# Patient Record
Sex: Male | Born: 1975 | Race: Black or African American | Hispanic: No | Marital: Single | Smoking: Former smoker
Health system: Southern US, Community
[De-identification: ages and names within clinical notes are randomized; demographics above are authoritative.]

## PROBLEM LIST (undated history)

## (undated) DIAGNOSIS — Z21 Asymptomatic human immunodeficiency virus [HIV] infection status: Secondary | ICD-10-CM

## (undated) DIAGNOSIS — J45909 Unspecified asthma, uncomplicated: Secondary | ICD-10-CM

## (undated) DIAGNOSIS — J40 Bronchitis, not specified as acute or chronic: Secondary | ICD-10-CM

## (undated) DIAGNOSIS — I10 Essential (primary) hypertension: Secondary | ICD-10-CM

## (undated) HISTORY — PX: WISDOM TOOTH EXTRACTION: SHX21

## (undated) HISTORY — PX: TONSILLECTOMY: SUR1361

## (undated) HISTORY — PX: HERNIA REPAIR: SHX51

---

## 2018-01-01 ENCOUNTER — Other Ambulatory Visit: Payer: Self-pay

## 2018-01-01 ENCOUNTER — Emergency Department
Admission: EM | Admit: 2018-01-01 | Discharge: 2018-01-01 | Disposition: A | Discharge status: Home / Self Care | Payer: Self-pay | Attending: Emergency Medicine | Admitting: Emergency Medicine

## 2018-01-01 ENCOUNTER — Encounter: Payer: Self-pay | Admitting: Emergency Medicine

## 2018-01-01 ENCOUNTER — Emergency Department: Payer: Self-pay

## 2018-01-01 DIAGNOSIS — I10 Essential (primary) hypertension: Secondary | ICD-10-CM | POA: Insufficient documentation

## 2018-01-01 DIAGNOSIS — R05 Cough: Secondary | ICD-10-CM | POA: Insufficient documentation

## 2018-01-01 DIAGNOSIS — R0981 Nasal congestion: Secondary | ICD-10-CM | POA: Insufficient documentation

## 2018-01-01 DIAGNOSIS — J40 Bronchitis, not specified as acute or chronic: Secondary | ICD-10-CM | POA: Insufficient documentation

## 2018-01-01 DIAGNOSIS — B20 Human immunodeficiency virus [HIV] disease: Secondary | ICD-10-CM | POA: Insufficient documentation

## 2018-01-01 DIAGNOSIS — F17210 Nicotine dependence, cigarettes, uncomplicated: Secondary | ICD-10-CM | POA: Insufficient documentation

## 2018-01-01 HISTORY — DX: Essential (primary) hypertension: I10

## 2018-01-01 HISTORY — DX: Asymptomatic human immunodeficiency virus (hiv) infection status: Z21

## 2018-01-01 HISTORY — DX: Bronchitis, not specified as acute or chronic: J40

## 2018-01-01 HISTORY — DX: Unspecified asthma, uncomplicated: J45.909

## 2018-01-01 LAB — CBC WITH DIFFERENTIAL/PLATELET
Abs Immature Granulocytes: 0.05 10*3/uL (ref 0.00–0.07)
BASOS ABS: 0 10*3/uL (ref 0.0–0.1)
Basophils Relative: 1 %
Eosinophils Absolute: 0.1 10*3/uL (ref 0.0–0.5)
Eosinophils Relative: 3 %
HCT: 41.2 % (ref 39.0–52.0)
Hemoglobin: 13.5 g/dL (ref 13.0–17.0)
Immature Granulocytes: 1 %
Lymphocytes Relative: 47 %
Lymphs Abs: 2 10*3/uL (ref 0.7–4.0)
MCH: 29.2 pg (ref 26.0–34.0)
MCHC: 32.8 g/dL (ref 30.0–36.0)
MCV: 89.2 fL (ref 80.0–100.0)
Monocytes Absolute: 0.5 10*3/uL (ref 0.1–1.0)
Monocytes Relative: 12 %
NEUTROS PCT: 36 %
NRBC: 0 % (ref 0.0–0.2)
Neutro Abs: 1.6 10*3/uL — ABNORMAL LOW (ref 1.7–7.7)
Platelets: 237 10*3/uL (ref 150–400)
RBC: 4.62 MIL/uL (ref 4.22–5.81)
RDW: 14.1 % (ref 11.5–15.5)
WBC: 4.3 10*3/uL (ref 4.0–10.5)

## 2018-01-01 LAB — COMPREHENSIVE METABOLIC PANEL
ALBUMIN: 3.8 g/dL (ref 3.5–5.0)
ALT: 29 U/L (ref 0–44)
AST: 43 U/L — ABNORMAL HIGH (ref 15–41)
Alkaline Phosphatase: 57 U/L (ref 38–126)
Anion gap: 8 (ref 5–15)
BUN: 14 mg/dL (ref 6–20)
CO2: 25 mmol/L (ref 22–32)
Calcium: 9.2 mg/dL (ref 8.9–10.3)
Chloride: 106 mmol/L (ref 98–111)
Creatinine, Ser: 1.1 mg/dL (ref 0.61–1.24)
GFR calc Af Amer: >60 mL/min (ref >60)
GFR calc non Af Amer: >60 mL/min (ref >60)
Glucose, Bld: 131 mg/dL — ABNORMAL HIGH (ref 70–99)
Potassium: 3.6 mmol/L (ref 3.5–5.1)
Sodium: 139 mmol/L (ref 135–145)
Total Bilirubin: 0.4 mg/dL (ref 0.3–1.2)
Total Protein: 8 g/dL (ref 6.5–8.1)

## 2018-01-01 LAB — TROPONIN I: Troponin I: <0.03 ng/mL (ref <0.03)

## 2018-01-01 MED ORDER — IPRATROPIUM-ALBUTEROL 0.5-2.5 (3) MG/3ML IN SOLN
3.0000 mL | Freq: Once | RESPIRATORY_TRACT | Status: AC
  Administered 2018-01-01: 3 mL via RESPIRATORY_TRACT
  Filled 2018-01-01: qty 3

## 2018-01-01 MED ORDER — ALBUTEROL SULFATE HFA 108 (90 BASE) MCG/ACT IN AERS: 2.0000 | INHALATION_SPRAY | Freq: Four times a day (QID) | RESPIRATORY_TRACT | 0 refills | Status: DC | PRN

## 2018-01-01 NOTE — Discharge Instructions (Addendum)
Please seek medical attention for any high fevers, chest pain, shortness of breath, change in behavior, persistent vomiting, bloody stool or any other new or concerning symptoms.  

## 2018-01-01 NOTE — ED Triage Notes (Signed)
Pt reports ongoing cough and congestion since Wednesday

## 2018-01-01 NOTE — ED Notes (Addendum)
Awaiting ct xray results

## 2018-01-01 NOTE — ED Triage Notes (Signed)
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  Pt reports taking mucinex and albuterol prior to coming to ED today

## 2018-01-01 NOTE — ED Provider Notes (Signed)
[facility] Emergency Department Provider Note   ____________________________________________   I have reviewed the triage vital signs and the nursing notes.   HISTORY  Chief Complaint Chest Pain; Nasal Congestion; and Cough   History limited by: Not Limited   HPI Dillon Hood is a 42 y.o. male who presents to the emergency department today because of concerns for cough, congestion and some chest tightness.  Patient states that he has a history of bronchitis.  He states that he has run out of his albuterol inhaler.  He states for the past few days he has noticed some increased difficulty with breathing, cough.  He denies any chest pain but states it has felt tight.  Patient denies any fevers.   Per medical record review patient has a history of asthma, bronchitis Past Medical History:  Diagnosis Date  . Asthma   . Bronchitis   . HIV positive (HCC)   . Hypertension     There are no active problems to display for this patient.   Past Surgical History:  Procedure Laterality Date  . HERNIA REPAIR     umbilical  . TONSILLECTOMY    . WISDOM TOOTH EXTRACTION      Prior to Admission medications   Not on File    Allergies Macadamia nut oil; Shellfish allergy; and Iodinated diagnostic agents  History reviewed. No pertinent family history.  Social History Social History   Tobacco Use  . Smoking status: Current Some Day Smoker    Packs/day: 0.25    Years: 25.00    Pack years: 6.25    Types: Cigarettes  . Smokeless tobacco: Never Used  Substance Use Topics  . Alcohol use: Not Currently  . Drug use: Not Currently    Types: Cocaine, Marijuana, Methamphetamines    Review of Systems Constitutional: No fever/chills Eyes: No visual changes. ENT: No sore throat. Cardiovascular: Positive for chest tightness Respiratory: Denies shortness of breath. Gastrointestinal: No abdominal pain.  No nausea, no vomiting.  No diarrhea.   Genitourinary:  Negative for dysuria. Musculoskeletal: Negative for back pain. Skin: Negative for rash. Neurological: Negative for headaches, focal weakness or numbness.  ____________________________________________   PHYSICAL EXAM:  VITAL SIGNS: ED Triage Vitals  Enc Vitals Group     BP 01/01/18 1947 (!) 144/80     Pulse Rate 01/01/18 1947 81     Resp 01/01/18 1947 18     Temp 01/01/18 1947 98.5 F (36.9 C)     Temp Source 01/01/18 1947 Oral     SpO2 01/01/18 1947 97 %     Weight 01/01/18 1947 280 lb (127 kg)     Height 01/01/18 1947 5\' 10"  (1.778 m)     Head Circumference --      Peak Flow --      Pain Score 01/01/18 2007 7     Pain Loc --      Pain Edu? --      Excl. in GC? --      Constitutional: Alert and oriented.  Eyes: Conjunctivae are normal.  ENT      Head: Normocephalic and atraumatic.      Nose: No congestion/rhinnorhea.      Mouth/Throat: Mucous membranes are moist.      Neck: No stridor. Hematological/Lymphatic/Immunilogical: No cervical lymphadenopathy. Cardiovascular: Normal rate, regular rhythm.  No murmurs, rubs, or gallops.  Respiratory: Normal respiratory effort. Diffuse expiratory wheezing.  Gastrointestinal: Soft and non tender. No rebound. No guarding.  Genitourinary: Deferred Musculoskeletal: Normal range  of motion in all extremities. No lower extremity edema. Neurologic:  Normal speech and language. No gross focal neurologic deficits are appreciated.  Skin:  Skin is warm, dry and intact. No rash noted. Psychiatric: Mood and affect are normal. Speech and behavior are normal. Patient exhibits appropriate insight and judgment.  ____________________________________________    LABS (pertinent positives/negatives)  CMP wnl except glu 131, ast 43 Trop <0.03 CBC wbc 4.3, hgb 13.5, plt 237  ____________________________________________   EKG  I, Phineas Semen, attending physician, personally viewed and interpreted this EKG  EKG Time: 1954 Rate:  73 Rhythm: normal sinus rhythm Axis: left axis deviation Intervals: qtc 412 QRS: LAFB ST changes: no st elevation Impression: abnormal ekg  ____________________________________________    RADIOLOGY  CXR No acute disease  ____________________________________________   PROCEDURES  Procedures  ____________________________________________   INITIAL IMPRESSION / ASSESSMENT AND PLAN / ED COURSE  Pertinent labs & imaging results that were available during my care of the patient were reviewed by me and considered in my medical decision making (see chart for details).   Patient presented to the emergency department today because of concerns for possible bronchitis.  On exam patient did have diffuse expiratory wheezing.  Chest x-ray without pneumonia or pneumothorax.  Patient was given DuoNeb treatment and on repeat auscultation wheezing had improved.  Patient said he felt better.  Will plan to give patient prescription of albuterol inhaler.   ____________________________________________   FINAL CLINICAL IMPRESSION(S) / ED DIAGNOSES  Final diagnoses:  Bronchitis     Note: This dictation was prepared with Dragon dictation. Any transcriptional errors that result from this process are unintentional     Phineas Semen, MD 01/01/18 2359

## 2018-01-02 ENCOUNTER — Encounter: Payer: Self-pay | Admitting: Emergency Medicine

## 2018-01-02 ENCOUNTER — Emergency Department
Admission: EM | Admit: 2018-01-02 | Discharge: 2018-01-02 | Disposition: A | Discharge status: Home / Self Care | Payer: Self-pay | Attending: Emergency Medicine | Admitting: Emergency Medicine

## 2018-01-02 DIAGNOSIS — J209 Acute bronchitis, unspecified: Secondary | ICD-10-CM | POA: Insufficient documentation

## 2018-01-02 DIAGNOSIS — I10 Essential (primary) hypertension: Secondary | ICD-10-CM | POA: Insufficient documentation

## 2018-01-02 DIAGNOSIS — B20 Human immunodeficiency virus [HIV] disease: Secondary | ICD-10-CM | POA: Insufficient documentation

## 2018-01-02 DIAGNOSIS — F17210 Nicotine dependence, cigarettes, uncomplicated: Secondary | ICD-10-CM | POA: Insufficient documentation

## 2018-01-02 DIAGNOSIS — R0981 Nasal congestion: Secondary | ICD-10-CM | POA: Insufficient documentation

## 2018-01-02 MED ORDER — PREDNISONE 20 MG PO TABS
40.0000 mg | ORAL_TABLET | ORAL | Status: AC
  Administered 2018-01-02: 40 mg via ORAL
  Filled 2018-01-02: qty 2

## 2018-01-02 MED ORDER — ALBUTEROL SULFATE (2.5 MG/3ML) 0.083% IN NEBU
INHALATION_SOLUTION | RESPIRATORY_TRACT | Status: AC
  Administered 2018-01-02: 5 mg via RESPIRATORY_TRACT
  Filled 2018-01-02: qty 6

## 2018-01-02 MED ORDER — AZITHROMYCIN 250 MG PO TABS: ORAL_TABLET | ORAL | 0 refills | Status: DC

## 2018-01-02 MED ORDER — PREDNISONE 20 MG PO TABS: 40.0000 mg | ORAL_TABLET | Freq: Every day | ORAL | 0 refills | Status: DC

## 2018-01-02 MED ORDER — ALBUTEROL SULFATE HFA 108 (90 BASE) MCG/ACT IN AERS: 2.0000 | INHALATION_SPRAY | RESPIRATORY_TRACT | 0 refills | Status: AC | PRN

## 2018-01-02 MED ORDER — ALBUTEROL SULFATE (2.5 MG/3ML) 0.083% IN NEBU
5.0000 mg | INHALATION_SOLUTION | Freq: Once | RESPIRATORY_TRACT | Status: AC
  Administered 2018-01-02: 5 mg via RESPIRATORY_TRACT

## 2018-01-02 MED ORDER — AZITHROMYCIN 500 MG PO TABS
500.0000 mg | ORAL_TABLET | Freq: Once | ORAL | Status: AC
  Administered 2018-01-02: 500 mg via ORAL
  Filled 2018-01-02: qty 1

## 2018-01-02 NOTE — ED Provider Notes (Signed)
[facility] Emergency Department Provider Note  ____________________________________________  Time seen: Approximately 10:27 AM  I have reviewed the triage vital signs and the nursing notes.   HISTORY  Chief Complaint Cough and Nasal Congestion    HPI Dillon Hood is a 42 y.o. male with a history of asthma, hypertension, HIV on antiretrovirals with good CD4 and viral load counts 1 month ago per the patient's report who complains of nonproductive cough wheezing shortness of breath and chest tightness for the past 2 to 3 days.  He came to the ED yesterday, felt better after receiving bronchodilators and was discharged home.  He unfortunately was unable to fill the albuterol prescription due to cost.  Last night his symptoms felt worse again and his primary care doctor is unable to see him until Thursday 2 days from now, so he returns to the ED today.   Symptoms are constant waxing and waning without aggravating or alleviating factors.  No history of MI or blood clots.  Denies any severe pleuritic pain or exertional symptoms.  No fevers chills sweats or weight loss.     Past Medical History:  Diagnosis Date  . Asthma   . Bronchitis   . HIV positive (HCC)   . Hypertension      There are no active problems to display for this patient.    Past Surgical History:  Procedure Laterality Date  . HERNIA REPAIR     umbilical  . TONSILLECTOMY    . WISDOM TOOTH EXTRACTION       Prior to Admission medications   Medication Sig Start Date End Date Taking? Authorizing Provider  albuterol (PROVENTIL HFA) 108 (90 Base) MCG/ACT inhaler Inhale 2 puffs into the lungs every 4 (four) hours as needed for wheezing or shortness of breath. 01/02/18   Sharman CheekStafford, Maalle Starrett, MD  albuterol (PROVENTIL HFA;VENTOLIN HFA) 108 (90 Base) MCG/ACT inhaler Inhale 2 puffs into the lungs every 6 (six) hours as needed for wheezing or shortness of breath. 01/01/18   Phineas SemenGoodman, Graydon, MD   azithromycin (ZITHROMAX Z-PAK) 250 MG tablet Take 2 tablets (500 mg) on  Day 1,  followed by 1 tablet (250 mg) once daily on Days 2 through 5. 01/02/18   Sharman CheekStafford, Jamal Haskin, MD  predniSONE (DELTASONE) 20 MG tablet Take 2 tablets (40 mg total) by mouth daily. 01/02/18   Sharman CheekStafford, Vontae Court, MD     Allergies Macadamia nut oil; Shellfish allergy; and Iodinated diagnostic agents   No family history on file.  Social History Social History   Tobacco Use  . Smoking status: Current Some Day Smoker    Packs/day: 0.25    Years: 25.00    Pack years: 6.25    Types: Cigarettes  . Smokeless tobacco: Never Used  Substance Use Topics  . Alcohol use: Not Currently  . Drug use: Not Currently    Types: Cocaine, Marijuana, Methamphetamines    Review of Systems  Constitutional:   No fever or chills.  ENT:   No sore throat.  Positive rhinorrhea. Cardiovascular:   Positive mild chest tightness, without syncope. Respiratory:   Positive shortness of breath and nonproductive cough. Gastrointestinal:   Negative for abdominal pain, vomiting and diarrhea.  Musculoskeletal:   Negative for focal pain or swelling All other systems reviewed and are negative except as documented above in ROS and HPI.  ____________________________________________   PHYSICAL EXAM:  VITAL SIGNS: ED Triage Vitals  Enc Vitals Group     BP 01/02/18 0956 126/63  Pulse Rate 01/02/18 0956 67     Resp 01/02/18 0956 20     Temp 01/02/18 0956 98.2 F (36.8 C)     Temp Source 01/02/18 0956 Oral     SpO2 01/02/18 0956 97 %     Weight 01/02/18 0955 280 lb (127 kg)     Height 01/02/18 0955 5\' 10"  (1.778 m)     Head Circumference --      Peak Flow --      Pain Score 01/02/18 1000 0     Pain Loc --      Pain Edu? --      Excl. in GC? --     Vital signs reviewed, nursing assessments reviewed.   Constitutional:   Alert and oriented. Non-toxic appearance. Eyes:   Conjunctivae are normal. EOMI. PERRL. ENT      Head:    Normocephalic and atraumatic.  There is a 3 cm lipoma on the left parietal scalp.  Patient reports this is previously been evaluated by doctors and deemed benign      Nose:   No congestion/rhinnorhea.       Mouth/Throat:   MMM, no pharyngeal erythema. No peritonsillar mass.       Neck:   No meningismus. Full ROM. Hematological/Lymphatic/Immunilogical:   No cervical lymphadenopathy. Cardiovascular:   RRR. Symmetric bilateral radial and DP pulses.  No murmurs. Cap refill less than 2 seconds. Respiratory:   Normal respiratory effort without tachypnea/retractions.  Good air entry in all lung fields.  FEV1 maneuver provokes wheezing and prolongation of expiratory phase. Gastrointestinal:   Soft and nontender. Non distended. There is no CVA tenderness.  No rebound, rigidity, or guarding.  Musculoskeletal:   Normal range of motion in all extremities. No joint effusions.  No lower extremity tenderness.  No edema. Neurologic:   Normal speech and language.  Motor grossly intact. No acute focal neurologic deficits are appreciated.  Skin:    Skin is warm, dry and intact. No rash noted.  No petechiae, purpura, or bullae.  ____________________________________________    LABS (pertinent positives/negatives) (all labs ordered are listed, but only abnormal results are displayed) Labs Reviewed - No data to display ____________________________________________   EKG    ____________________________________________    RADIOLOGY  Dg Chest 2 View  Result Date: 01/01/2018 CLINICAL DATA:  Chest congestion and tightness as well as low back pain. EXAM: CHEST - 2 VIEW COMPARISON:  None. FINDINGS: The heart size and mediastinal contours are within normal limits. Both lungs are clear. The visualized skeletal structures are unremarkable. IMPRESSION: No active cardiopulmonary disease. Electronically Signed   By: Elberta Fortis M.D.   On: 01/01/2018 20:38     ____________________________________________   PROCEDURES Procedures  ____________________________________________    CLINICAL IMPRESSION / ASSESSMENT AND PLAN / ED COURSE  Pertinent labs & imaging results that were available during my care of the patient were reviewed by me and considered in my medical decision making (see chart for details).    Patient presents with wheezing nonproductive cough and nasal congestion consistent with URI and acute bronchitis.  He was evaluated here yesterday and had an EKG and chest x-ray yesterday that were unremarkable.  No evolving symptoms.  Vital signs are normal.  Most likely viral in nature, but with his underlying HIV despite well controlled, I will start him on azithromycin as well in addition to providing him a short prednisone prescription with his recurrent lung disease.  He has been given information for the medication management clinic,  and I was also provide a pharmacy discount card to help find solutions to his financial constraints.  Considering the patient's symptoms, medical history, and physical examination today, I have low suspicion for ACS, PE, TAD, pneumothorax, carditis, mediastinitis, pneumonia, CHF, or sepsis.  Suitable for continued outpatient follow-up.      ____________________________________________   FINAL CLINICAL IMPRESSION(S) / ED DIAGNOSES    Final diagnoses:  Acute bronchitis, unspecified organism     ED Discharge Orders         Ordered    azithromycin (ZITHROMAX Z-PAK) 250 MG tablet     01/02/18 1027    predniSONE (DELTASONE) 20 MG tablet  Daily     01/02/18 1027    albuterol (PROVENTIL HFA) 108 (90 Base) MCG/ACT inhaler  Every 4 hours PRN     01/02/18 1027          Portions of this note were generated with dragon dictation software. Dictation errors may occur despite best attempts at proofreading.    Sharman Cheek, MD 01/02/18 1031

## 2018-01-02 NOTE — ED Triage Notes (Signed)
Pt reports was seen here last night and given a breathing treatment and a prescription for an inhaler but he is not able to get it filled at this time and feels like he needs another treatment and maybe a steroid.

## 2020-01-15 ENCOUNTER — Emergency Department (HOSPITAL_COMMUNITY): Payer: Self-pay

## 2020-01-15 ENCOUNTER — Emergency Department (HOSPITAL_COMMUNITY)
Admission: EM | Admit: 2020-01-15 | Discharge: 2020-01-15 | Disposition: A | Discharge status: Home / Self Care | Payer: Self-pay | Attending: Emergency Medicine | Admitting: Emergency Medicine

## 2020-01-15 ENCOUNTER — Encounter (HOSPITAL_COMMUNITY): Payer: Self-pay

## 2020-01-15 DIAGNOSIS — R569 Unspecified convulsions: Secondary | ICD-10-CM | POA: Insufficient documentation

## 2020-01-15 DIAGNOSIS — K2970 Gastritis, unspecified, without bleeding: Secondary | ICD-10-CM | POA: Insufficient documentation

## 2020-01-15 DIAGNOSIS — Z21 Asymptomatic human immunodeficiency virus [HIV] infection status: Secondary | ICD-10-CM | POA: Insufficient documentation

## 2020-01-15 DIAGNOSIS — Z79899 Other long term (current) drug therapy: Secondary | ICD-10-CM | POA: Insufficient documentation

## 2020-01-15 DIAGNOSIS — I10 Essential (primary) hypertension: Secondary | ICD-10-CM | POA: Insufficient documentation

## 2020-01-15 DIAGNOSIS — F17210 Nicotine dependence, cigarettes, uncomplicated: Secondary | ICD-10-CM | POA: Insufficient documentation

## 2020-01-15 DIAGNOSIS — J45909 Unspecified asthma, uncomplicated: Secondary | ICD-10-CM | POA: Insufficient documentation

## 2020-01-15 LAB — BASIC METABOLIC PANEL
Anion gap: 8 (ref 5–15)
BUN: 10 mg/dL (ref 6–20)
CO2: 26 mmol/L (ref 22–32)
Calcium: 8.9 mg/dL (ref 8.9–10.3)
Chloride: 104 mmol/L (ref 98–111)
Creatinine, Ser: 0.99 mg/dL (ref 0.61–1.24)
GFR, Estimated: >60 mL/min (ref >60)
Glucose, Bld: 110 mg/dL — ABNORMAL HIGH (ref 70–99)
Potassium: 3.7 mmol/L (ref 3.5–5.1)
Sodium: 138 mmol/L (ref 135–145)

## 2020-01-15 LAB — CBG MONITORING, ED: Glucose-Capillary: 107 mg/dL — ABNORMAL HIGH (ref 70–99)

## 2020-01-15 LAB — URINALYSIS, ROUTINE W REFLEX MICROSCOPIC
Bilirubin Urine: NEGATIVE
Glucose, UA: NEGATIVE mg/dL
Hgb urine dipstick: NEGATIVE
Ketones, ur: NEGATIVE mg/dL
Leukocytes,Ua: NEGATIVE
Nitrite: NEGATIVE
Protein, ur: NEGATIVE mg/dL
Specific Gravity, Urine: 1.018 (ref 1.005–1.030)
pH: 5 (ref 5.0–8.0)

## 2020-01-15 LAB — CBC
HCT: 44.6 % (ref 39.0–52.0)
Hemoglobin: 14.5 g/dL (ref 13.0–17.0)
MCH: 29.1 pg (ref 26.0–34.0)
MCHC: 32.5 g/dL (ref 30.0–36.0)
MCV: 89.6 fL (ref 80.0–100.0)
Platelets: 260 10*3/uL (ref 150–400)
RBC: 4.98 MIL/uL (ref 4.22–5.81)
RDW: 15.3 % (ref 11.5–15.5)
WBC: 6.4 10*3/uL (ref 4.0–10.5)
nRBC: 0 % (ref 0.0–0.2)

## 2020-01-15 LAB — TROPONIN I (HIGH SENSITIVITY)
Troponin I (High Sensitivity): 4 ng/L (ref <18)
Troponin I (High Sensitivity): 4 ng/L (ref <18)

## 2020-01-15 MED ORDER — OMEPRAZOLE 20 MG PO CPDR
20.0000 mg | DELAYED_RELEASE_CAPSULE | Freq: Every day | ORAL | 0 refills | Status: DC
Start: 2020-01-15 — End: 2023-03-01

## 2020-01-15 NOTE — Discharge Instructions (Signed)
[place] law prevents people with seizures or fainting from driving or operating dangerous machinery until they are free of seizures or fainting for 6 months.   We suspect that you had seizure-like activity and could be having gastritis or ileus.  Start taking the medications prescribed. Diet should be clear liquid for the next 2 or 3 days, and then slowly advance as needed. Start taking an over-the-counter stool softener as well.  You have declined neurology consultation while in the hospital.  We request that he follow-up with neurologist in the clinic.

## 2020-01-15 NOTE — ED Provider Notes (Addendum)
[facility]-EMERGENCY DEPT Provider Note   CSN: [id number] Arrival date & time: 01/15/20  0915     History Chief Complaint  Patient presents with  . Weakness    Dillon Hood is a 44 y.o. male.  HPI    44 year old male comes in a chief complaint of weakness, nausea, unresponsive spell.  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  He also admits to noncompliance to his medications.  Patient reports that he is primarily in the ER because of weakness that started on Saturday.  At that time he was at a party drinking tequila and having shots with his friends.  Soon after he noted that he was being helped by his friends.  They informed him that he had a staring spell where he was completely unresponsive.  He has had 3 episodes at least this year that were similar.  He reports that often he gets hot before the onset of the symptoms.  There has not been any " flailing" noted by his family members during these events.  He is not confused after it.  There is family history of seizures, but they were provoked.   Patient has been having weakness since then.  He has nausea and bloating without vomiting, fevers, chills.  Patient has no UTI-like symptoms, cough, chest pain.  Past Medical History:  Diagnosis Date  . Asthma   . Bronchitis   . HIV positive (HCC)   . Hypertension     There are no problems to display for this patient.   Past Surgical History:  Procedure Laterality Date  . HERNIA REPAIR     umbilical  . TONSILLECTOMY    . WISDOM TOOTH EXTRACTION         History reviewed. No pertinent family history.  Social History   Tobacco Use  . Smoking status: Current Some Day Smoker    Packs/day: 0.25    Years: 25.00    Pack years: 6.25    Types: Cigarettes  . Smokeless tobacco: Never Used  Vaping Use  . Vaping Use: Never used  Substance Use Topics  . Alcohol use: Not Currently  . Drug use: Not Currently    Types: Cocaine,  Marijuana, Methamphetamines    Home Medications Prior to Admission medications   Medication Sig Start Date End Date Taking? Authorizing Provider  amLODipine (NORVASC) 10 MG tablet Take 10 mg by mouth daily. 08/23/19  Yes [provider]  BIKTARVY 50-200-25 MG TABS tablet Take 1 tablet by mouth daily. 08/23/19  Yes [provider]  lisinopril (ZESTRIL) 20 MG tablet Take 20 mg by mouth daily. 08/23/19  Yes [provider]  albuterol (PROVENTIL HFA) 108 (90 Base) MCG/ACT inhaler Inhale 2 puffs into the lungs every 4 (four) hours as needed for wheezing or shortness of breath. Patient not taking: No sig reported 01/02/18   Sharman Cheek, MD  albuterol (PROVENTIL HFA;VENTOLIN HFA) 108 (90 Base) MCG/ACT inhaler Inhale 2 puffs into the lungs every 6 (six) hours as needed for wheezing or shortness of breath. 01/01/18   Phineas Semen, MD  azithromycin (ZITHROMAX Z-PAK) 250 MG tablet Take 2 tablets (500 mg) on  Day 1,  followed by 1 tablet (250 mg) once daily on Days 2 through 5. Patient not taking: Reported on 01/15/2020 01/02/18   Sharman Cheek, MD  predniSONE (DELTASONE) 20 MG tablet Take 2 tablets (40 mg total) by mouth daily. Patient not taking: Reported on 01/15/2020 01/02/18   Sharman Cheek,  MD    Allergies    Macadamia nut oil, Shellfish allergy, and Iodinated diagnostic agents  Review of Systems   Review of Systems  Constitutional: Positive for activity change.  Respiratory: Negative for shortness of breath.   Cardiovascular: Negative for chest pain.  Gastrointestinal: Positive for abdominal pain and nausea. Negative for diarrhea and vomiting.  Allergic/Immunologic: Positive for immunocompromised state.  Hematological: Does not bruise/bleed easily.  All other systems reviewed and are negative.   Physical Exam Updated Vital Signs BP (!) 156/113   Pulse (!) 57   Temp 98.7 F (37.1 C)   Resp 15   Ht 5\' 10"  (1.778 m)   Wt 113.4 kg   SpO2 100%    BMI 35.87 kg/m   Physical Exam Vitals and nursing note reviewed.  Constitutional:      Appearance: He is well-developed.  HENT:     Head: Atraumatic.  Cardiovascular:     Rate and Rhythm: Normal rate.  Pulmonary:     Effort: Pulmonary effort is normal.  Musculoskeletal:     Cervical back: Neck supple.  Skin:    General: Skin is warm.  Neurological:     Mental Status: He is alert and oriented to person, place, and time.     Comments: Cerebellar exam is normal (finger to nose) Sensory exam normal for bilateral upper and lower extremities - and patient is able to discriminate between sharp and dull. Motor exam is 4+/5      ED Results / Procedures / Treatments   Labs (all labs ordered are listed, but only abnormal results are displayed) Labs Reviewed  BASIC METABOLIC PANEL - Abnormal; Notable for the following components:      Result Value   Glucose, Bld 110 (*)    All other components within normal limits  CBG MONITORING, ED - Abnormal; Notable for the following components:   Glucose-Capillary 107 (*)    All other components within normal limits  CBC  URINALYSIS, ROUTINE W REFLEX MICROSCOPIC  TROPONIN I (HIGH SENSITIVITY)  TROPONIN I (HIGH SENSITIVITY)    EKG EKG Interpretation  Date/Time:  Tuesday January 15 2020 10:03:00 EST Ventricular Rate:  73 PR Interval:    QRS Duration: 113 QT Interval:  383 QTC Calculation: 422 R Axis:   -63 Text Interpretation: Sinus rhythm 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 Additional history provided: Patient reports nausea. EXAM: CT HEAD WITHOUT CONTRAST TECHNIQUE: Contiguous axial images were obtained from the base of the skull through the vertex without intravenous contrast.  COMPARISON:  No pertinent prior exams available for comparison. FINDINGS: Brain: Cerebral volume is normal for age. There is no acute intracranial hemorrhage. No demarcated cortical infarct. No extra-axial fluid collection. No evidence of intracranial mass. No midline shift. Vascular: No hyperdense vessel. Skull: Normal. Negative for fracture or focal lesion. 1.4 cm exostosis arising from the midline occipital calvarium. Sinuses/Orbits: Visualized orbits show no acute finding. No significant paranasal sinus disease at the imaged levels. Other: Nonspecific 4.4 x 3.5 cm low attenuation lesion within the left parietal scalp. IMPRESSION: No evidence of acute intracranial abnormality. 4.4 cm low-attenuation lesion within the left parietal scalp. This could reflect a large sebaceous cyst, but is nonspecific and incompletely characterized on this non-contrast exam. Direct visualization is recommended. Additionally, non-emergent CT imaging with  contrast can be considered for further characterization. 1.4 cm exostosis projecting from the midline occipital calvarium. Electronically Signed   By: Jackey Loge DO   On: 01/15/2020 16:29   DG ABD ACUTE 2+V W 1V CHEST  Result Date: 01/15/2020 CLINICAL DATA:  Abdominal discomfort. Nausea. Weakness. Evaluate for small bowel obstruction. EXAM: DG ABDOMEN ACUTE WITH 1 VIEW CHEST COMPARISON:  None. FINDINGS: The cardiomediastinal contours are normal. The lungs are clear. There is no free intra-abdominal air. No dilated bowel loops to suggest obstruction. There is increased small bowel gas within nondilated bowel in the central abdomen. Small volume of stool throughout the colon. No radiopaque calculi. No acute osseous abnormalities are seen. IMPRESSION: 1. No evidence of bowel obstruction. Increased small bowel gas in the central abdomen can be seen with enteritis. 2. Clear lungs. Electronically Signed   By: Narda Rutherford M.D.   On: 01/15/2020 15:47    Procedures Procedures  (including critical care time)  Medications Ordered in ED Medications - No data to display  ED Course  I have reviewed the triage vital signs and the nursing notes.  Pertinent labs & imaging results that were available during my care of the patient were reviewed by me and considered in my medical decision making (see chart for details).  Clinical Course as of 01/15/20 1638  Tue Jan 15, 2020  1637 The patient appears reasonably screened and/or stabilized for discharge and I doubt any other medical condition or other [facility] requiring further screening, evaluation, or treatment in the ED at this time prior to discharge.  Results from the ER workup discussed with the patient face to face and all questions answered to the best of my ability. The patient is safe for discharge with strict return precautions.  [AN]    Clinical Course User Index [AN] Derwood Kaplan, MD   MDM Rules/Calculators/A&P                          44 year old male with HIV comes in a chief complaint of seizure-like activity.  He is also complaining of some weakness, abdominal discomfort and nausea.  He has history of abdominal hernia.  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  Patient declined teleneuro consult evaluation.  He prefers following up with his outpatient doctor or outpatient neurology.  He does not want to start any medication as he is noncompliant with his current medications.  [place] driving laws have been discussed with him.  Return precautions also discussed with him.   For the abdominal discomfort and nausea, acute abdominal series has been ordered.  He is passing flatus and had his last BM last night.  The BM is not normal.  Acute abdominal series ordered to screen for any signs of SBO.  If the acute abdominal series is reassuring then we will proceed with wait and watch approach and patient will return to the ER if he starts having severe pain, is not passing flatus.  Patient wants to leave  against medical advice without getting full neuro evaluation. Patient understands that his actions will lead to inadequate medical workup, and that he is at risk of complications of missed diagnosis, which includes morbidity and mortality.  Alternative options discussed getting teleneurology evaluation and prescription, for him to them later decide if he wants to take it or not. Opportunity to change mind given. Patient is demonstrating good capacity to make decision. Patient understands that he/she needs to return to the ER  immediately if his/her symptoms get worse.    Final Clinical Impression(s) / ED Diagnoses Final diagnoses:  Seizure-like activity (HCC)  Gastritis without bleeding, unspecified chronicity, unspecified gastritis type    Rx / DC Orders ED Discharge Orders    None       Derwood Kaplan, MD 01/15/20 1638    Derwood Kaplan, MD 01/15/20 1643

## 2020-01-15 NOTE — ED Notes (Signed)
Patient off the floor for procedure.

## 2020-01-15 NOTE — ED Triage Notes (Signed)
Pt presents with c/o weakness and shakiness. Pt reports that his chest feels tight, no pain. Pt reports he did smoke meth yesterday. Pt also reports decreased appetite.

## 2020-01-16 NOTE — Patient Outreach (Signed)
[facility] went by Pt room twice an was unable to locate Pt. [facility] made an attempt to followed up with Pt in community. [facility] will call phone number listed to attempt to reach Pt.

## 2020-03-27 ENCOUNTER — Ambulatory Visit: Payer: Self-pay | Admitting: Neurology

## 2020-09-09 ENCOUNTER — Other Ambulatory Visit (HOSPITAL_COMMUNITY): Payer: Self-pay

## 2020-09-09 ENCOUNTER — Telehealth: Payer: Self-pay

## 2020-09-09 NOTE — Telephone Encounter (Signed)
[facility] Patient Advocate Encounter ? ?Insurance verification completed.   ? ?The patient is uninsured and will need patient assistance for medication. ? ?We can complete the application and will need to meet with the patient for signatures and income documentation. ? ?Tyliah Schlereth, CPhT ?Specialty Pharmacy Patient Advocate ?[facility] ?Phone: [phone] ?Fax:  [phone]  ?

## 2020-09-12 ENCOUNTER — Ambulatory Visit: Payer: Self-pay | Admitting: Family

## 2020-09-12 ENCOUNTER — Ambulatory Visit: Payer: Self-pay

## 2020-09-12 ENCOUNTER — Ambulatory Visit: Payer: Self-pay | Admitting: Pharmacist

## 2020-09-23 ENCOUNTER — Ambulatory Visit: Payer: Self-pay

## 2020-09-23 ENCOUNTER — Ambulatory Visit (INDEPENDENT_AMBULATORY_CARE_PROVIDER_SITE_OTHER): Payer: Self-pay | Admitting: Internal Medicine

## 2020-09-23 ENCOUNTER — Ambulatory Visit (INDEPENDENT_AMBULATORY_CARE_PROVIDER_SITE_OTHER): Payer: Self-pay | Admitting: Pharmacist

## 2020-09-23 ENCOUNTER — Encounter: Payer: Self-pay | Admitting: Pharmacist

## 2020-09-23 ENCOUNTER — Encounter: Payer: Self-pay | Admitting: Internal Medicine

## 2020-09-23 ENCOUNTER — Other Ambulatory Visit: Payer: Self-pay

## 2020-09-23 VITALS — BP 156/107 | HR 98 | Temp 98.4°F | Ht 70.0 in | Wt 229.0 lb

## 2020-09-23 DIAGNOSIS — Z79899 Other long term (current) drug therapy: Secondary | ICD-10-CM | POA: Insufficient documentation

## 2020-09-23 DIAGNOSIS — Z23 Encounter for immunization: Secondary | ICD-10-CM

## 2020-09-23 DIAGNOSIS — Z113 Encounter for screening for infections with a predominantly sexual mode of transmission: Secondary | ICD-10-CM | POA: Insufficient documentation

## 2020-09-23 DIAGNOSIS — F1910 Other psychoactive substance abuse, uncomplicated: Secondary | ICD-10-CM | POA: Insufficient documentation

## 2020-09-23 DIAGNOSIS — B20 Human immunodeficiency virus [HIV] disease: Secondary | ICD-10-CM | POA: Insufficient documentation

## 2020-09-23 DIAGNOSIS — I10 Essential (primary) hypertension: Secondary | ICD-10-CM | POA: Insufficient documentation

## 2020-09-23 DIAGNOSIS — R369 Urethral discharge, unspecified: Secondary | ICD-10-CM

## 2020-09-23 MED ORDER — BIKTARVY 50-200-25 MG PO TABS: 1.0000 | ORAL_TABLET | Freq: Every day | ORAL | 5 refills | Status: AC

## 2020-09-23 MED ORDER — BIKTARVY 50-200-25 MG PO TABS: 1.0000 | ORAL_TABLET | Freq: Every day | ORAL | 0 refills | Status: DC

## 2020-09-23 MED ORDER — CEFTRIAXONE SODIUM 500 MG IJ SOLR
500.0000 mg | Freq: Once | INTRAMUSCULAR | Status: AC
  Administered 2020-09-23: 500 mg via INTRAMUSCULAR

## 2020-09-23 MED ORDER — AZITHROMYCIN 250 MG PO TABS
1000.0000 mg | ORAL_TABLET | Freq: Once | ORAL | Status: AC
  Administered 2020-09-23: 1000 mg via ORAL

## 2020-09-23 NOTE — Progress Notes (Signed)
Patient ID: Dillon Hood, male    DOB: [birth year], 45 y.o.   MRN: [id number]  Reason for visit: to establish care as a new patient with HIV  HPI:   Patient was first diagnosed in 2010.  He was tested as part risk factor screening. The CD4 count is 547 last checked earlier this year, viral load was 10,700.  He is unaware of a CD4 nadir.  He endorses MSM activity.  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  He has been on blood pressure medication but has been out.  He currently has penile discharge he describes as pus like discharge.   He has a history of gonorrhea, chlamydia and syphilis.  He most recently was on Biktarvy but has been out. Previously was on Complera then [facility]  He has had multiple recent partners.   Past Medical History:  Diagnosis Date   Asthma    Bronchitis    HIV positive (HCC)    Hypertension     Prior to Admission medications   Medication Sig Start Date End Date Taking? Authorizing Provider  albuterol (PROVENTIL HFA) 108 (90 Base) MCG/ACT inhaler Inhale 2 puffs into the lungs every 4 (four) hours as needed for wheezing or shortness of breath. 01/02/18  Yes Sharman Cheek, MD  omeprazole (PRILOSEC) 20 MG capsule Take 1 capsule (20 mg total) by mouth daily. 01/15/20  Yes Derwood Kaplan, MD  albuterol (PROVENTIL HFA;VENTOLIN HFA) 108 (90 Base) MCG/ACT inhaler Inhale 2 puffs into the lungs every 6 (six) hours as needed for wheezing or shortness of breath. 01/01/18   Phineas Semen, MD  amLODipine (NORVASC) 10 MG tablet Take 10 mg by mouth daily. Patient not taking: Reported on 09/23/2020 08/23/19   [provider]  azithromycin (ZITHROMAX Z-PAK) 250 MG tablet Take 2 tablets (500 mg) on  Day 1,  followed by 1 tablet (250 mg) once daily on Days 2 through 5. Patient not taking: No sig reported 01/02/18   Sharman Cheek, MD  BIKTARVY 50-200-25 MG TABS tablet Take 1 tablet by mouth daily. Patient not taking: Reported on  09/23/2020 08/23/19   [provider]  lisinopril (ZESTRIL) 20 MG tablet Take 20 mg by mouth daily. Patient not taking: Reported on 09/23/2020 08/23/19   [provider]  predniSONE (DELTASONE) 20 MG tablet Take 2 tablets (40 mg total) by mouth daily. Patient not taking: No sig reported 01/02/18   Sharman Cheek, MD    Allergies  Allergen Reactions   Macadamia Nut Oil Anaphylaxis   Shellfish Allergy Anaphylaxis   Iodinated Diagnostic Agents Other (See Comments)    Pt is unsure of this allergy     Social History   Tobacco Use   Smoking status: Some Days    Packs/day: 0.25    Years: 25.00    Pack years: 6.25    Types: Cigarettes   Smokeless tobacco: Never  Vaping Use   Vaping Use: Never used  Substance Use Topics   Alcohol use: Not Currently   Drug use: Not Currently    Types: Cocaine, Marijuana, Methamphetamines   FMH: + cardiac disease  Review of Systems Constitutional: negative for fevers, chills, and fatigue Respiratory: negative for cough Gastrointestinal: negative for nausea and diarrhea Genitourinary: positive for penile discharge Integument/breast: negative for rash Hematologic/lymphatic: negative for lymphadenopathy Musculoskeletal: negative for myalgias and arthralgias All other systems reviewed and are negative    CONSTITUTIONAL:in no apparent distress There were no vitals filed for this visit. EYES: anicteric HENT:  no thrush CARD:Cor RRR RESP:clear ZE:SPQZR sounds are normal, liver is not enlarged, spleen is not enlarged MS:no pedal edema noted SKIN:no rashes NEURO: non-focal  No results found for: HIV1RNAQUANT No components found for: HIV1GENOTYPRPLUS No components found for: THELPERCELL  Assessment: new patient here with established HIV.  Discussed with patient treatment options and side effects, benefits of treatment, long term outcomes.  I discussed the severity of untreated HIV including higher cancer risk, opportunistic  infections, renal failure.  Also discussed needing to use condoms, partner disclosure, necessary vaccines, blood monitoring.  All questions answered.    Plan: 1)  resume Biktarvy, samples provided 2) financial paperwork 3) Jynneos vaccine 4) labs today 5) condoms provided 6) counseling/substance abuse services 7) THP 8) Dental referral placed today for [facility]. Information to schedule appointment completed today.   9) internal medicine referral 10) empiric treatment for gonorrhea and chlamydia

## 2020-09-23 NOTE — Progress Notes (Signed)
    [facility]  Name:  Draden Cottingham    MRN: [id number] DOB: [birth year]   09/23/2020  Mr. Reinwald was observed post JYNNEOS immunization for 15 minutes without incident. He was provided with Vaccine Information Sheet and instruction to access the V-Safe system.   Mr. Hanken was instructed to call 911 with any severe reactions post vaccine: Difficulty breathing  Swelling of face and throat  A fast heartbeat  A bad rash all over body  Dizziness and weakness      Patient treated with azithromycin and ceftriaxone, tolerated well. Advised no sex for 10 days. Patient verbalized understanding and has no further questions.   Sandie Ano, RN

## 2020-09-23 NOTE — Addendum Note (Signed)
Addended by: Harley Alto on: 09/23/2020 11:08 AM   Modules accepted: Orders

## 2020-09-23 NOTE — Progress Notes (Signed)
Medication Samples have been provided to the patient.  Drug name: Biktarvy        Strength: 50/200/25 mg       Qty: 21 tablets (3 bottles   LOT: [id number]   Exp.Date: 07/03/2022  Dosing instructions: Take one tablet by mouth once daily  The patient has been instructed regarding the correct time, dose, and frequency of taking this medication, including desired effects and most common side effects.   Reggie Bise L. Maaliyah Adolph, PharmD, BCIDP, AAHIVP, CPP Clinical Pharmacist Practitioner Infectious Diseases Clinical Pharmacist [facility] 01/14/2020, 10:07 AM  

## 2020-09-24 LAB — URINE CYTOLOGY ANCILLARY ONLY
Chlamydia: NEGATIVE
Comment: NEGATIVE
Comment: NORMAL
Neisseria Gonorrhea: NEGATIVE

## 2020-09-24 NOTE — Progress Notes (Signed)
09/24/2020  HPI: Dillon Hood is a 45 y.o. male who presents to the [facility] clinic for HIV follow-up.  Patient Active Problem List   Diagnosis Date Noted   Human immunodeficiency virus (HIV) disease (HCC) 09/23/2020   HTN (hypertension) 09/23/2020   Substance abuse (HCC) 09/23/2020   Routine screening for STI (sexually transmitted infection) 09/23/2020   Encounter for long-term (current) use of high-risk medication 09/23/2020    Patient's Medications  New Prescriptions   BICTEGRAVIR-EMTRICITABINE-TENOFOVIR AF (BIKTARVY) 50-200-25 MG TABS TABLET    Take 1 tablet by mouth daily.  Previous Medications   ALBUTEROL (PROVENTIL HFA) 108 (90 BASE) MCG/ACT INHALER    Inhale 2 puffs into the lungs every 4 (four) hours as needed for wheezing or shortness of breath.   ALBUTEROL (PROVENTIL HFA;VENTOLIN HFA) 108 (90 BASE) MCG/ACT INHALER    Inhale 2 puffs into the lungs every 6 (six) hours as needed for wheezing or shortness of breath.   AMLODIPINE (NORVASC) 10 MG TABLET    Take 10 mg by mouth daily.   BIKTARVY 50-200-25 MG TABS TABLET    Take 1 tablet by mouth daily.   LISINOPRIL (ZESTRIL) 20 MG TABLET    Take 20 mg by mouth daily.   OMEPRAZOLE (PRILOSEC) 20 MG CAPSULE    Take 1 capsule (20 mg total) by mouth daily.  Modified Medications   No medications on file  Discontinued Medications   No medications on file    Allergies: Allergies  Allergen Reactions   Macadamia Nut Oil Anaphylaxis   Shellfish Allergy Anaphylaxis   Iodinated Diagnostic Agents Other (See Comments)    Pt is unsure of this allergy     Past Medical History: Past Medical History:  Diagnosis Date   Asthma    Bronchitis    HIV positive (HCC)    Hypertension     Social History: Social History   Socioeconomic History   Marital status: Single    Spouse name: Not on file   Number of children: Not on file   Years of education: Not on file   Highest education level: Not on file  Occupational History   Not on file   Tobacco Use   Smoking status: Every Day    Packs/day: 0.25    Years: 25.00    Pack years: 6.25    Types: Cigarettes   Smokeless tobacco: Never  Vaping Use   Vaping Use: Never used  Substance and Sexual Activity   Alcohol use: Not Currently   Drug use: Not Currently    Types: Cocaine, Marijuana, Methamphetamines    Comment: smokes meth and "booty bumps"   Sexual activity: Yes    Partners: Male    Comment: condoms given 09/2020  Other Topics Concern   Not on file  Social History Narrative   Not on file   Social Determinants of Health   Financial Resource Strain: Not on file  Food Insecurity: Not on file  Transportation Needs: Not on file  Physical Activity: Not on file  Stress: Not on file  Social Connections: Not on file    Labs: No results found for: HIV1RNAQUANT, HIV1RNAVL, CD4TABS  RPR and STI No results found for: LABRPR, RPRTITER  No flowsheet data found.  Hepatitis B No results found for: HEPBSAB, HEPBSAG, HEPBCAB Hepatitis C No results found for: HEPCAB, HCVRNAPCRQN Hepatitis A No results found for: HAV Lipids: Lab Results  Component Value Date   CHOL 187 09/23/2020   TRIG 45 09/23/2020   HDL 73 09/23/2020  CHOLHDL 2.6 09/23/2020   LDLCALC 101 (H) 09/23/2020    Current HIV Regimen: Biktarvy  Assessment: [name] presents today to establish care at our clinic with Dr. Luciana Axe for his HIV infection. He has been taking Biktarvy but has recently run out. He has no insurance and will apply for [employer]. Provided him with 3 weeks of samples today. Asked him to call if he needed more. He has no issues tolerating or taking Biktarvy when he has it. He is interested in [place], so I explained our process for that. Discussed that he will need to become undetectable and get approved for HMAP to be able to start the injection. He will discuss this with Dr. Luciana Axe.  Plan: - Restart Biktarvy, samples provided  Seraphina Mitchner L. Jenica Costilow, PharmD, BCIDP, AAHIVP,  CPP Clinical Pharmacist Practitioner Infectious Diseases Clinical Pharmacist [facility] 09/24/2020, 9:50 AM

## 2020-09-25 LAB — T-HELPER CELL (CD4) - (RCID CLINIC ONLY)
CD4 % Helper T Cell: 36 % (ref 33–65)
CD4 T Cell Abs: 504 /uL (ref 400–1790)

## 2020-09-27 LAB — HEPATITIS B SURFACE ANTIGEN: Hepatitis B Surface Ag: NONREACTIVE

## 2020-09-27 LAB — QUANTIFERON-TB GOLD PLUS
Mitogen-NIL: >10 IU/mL
NIL: 0.03 IU/mL
QuantiFERON-TB Gold Plus: NEGATIVE
TB1-NIL: <0 IU/mL
TB2-NIL: <0 IU/mL

## 2020-09-27 LAB — COMPLETE METABOLIC PANEL WITH GFR
AG Ratio: 1.1 (calc) (ref 1.0–2.5)
ALT: 24 U/L (ref 9–46)
AST: 40 U/L (ref 10–40)
Albumin: 4.1 g/dL (ref 3.6–5.1)
Alkaline phosphatase (APISO): 59 U/L (ref 36–130)
BUN: 16 mg/dL (ref 7–25)
CO2: 28 mmol/L (ref 20–32)
Calcium: 9.3 mg/dL (ref 8.6–10.3)
Chloride: 103 mmol/L (ref 98–110)
Creat: 1 mg/dL (ref 0.60–1.29)
Globulin: 3.9 g/dL (calc) — ABNORMAL HIGH (ref 1.9–3.7)
Glucose, Bld: 88 mg/dL (ref 65–99)
Potassium: 4.1 mmol/L (ref 3.5–5.3)
Sodium: 137 mmol/L (ref 135–146)
Total Bilirubin: 0.5 mg/dL (ref 0.2–1.2)
Total Protein: 8 g/dL (ref 6.1–8.1)
eGFR: 95 mL/min/{1.73_m2} (ref >=60)

## 2020-09-27 LAB — CBC WITH DIFFERENTIAL/PLATELET
Absolute Monocytes: 493 cells/uL (ref 200–950)
Basophils Absolute: 28 cells/uL (ref 0–200)
Basophils Relative: 0.5 %
Eosinophils Absolute: 39 cells/uL (ref 15–500)
Eosinophils Relative: 0.7 %
HCT: 41.5 % (ref 38.5–50.0)
Hemoglobin: 13.3 g/dL (ref 13.2–17.1)
Lymphs Abs: 1422 cells/uL (ref 850–3900)
MCH: 27.9 pg (ref 27.0–33.0)
MCHC: 32 g/dL (ref 32.0–36.0)
MCV: 87 fL (ref 80.0–100.0)
MPV: 9.7 fL (ref 7.5–12.5)
Monocytes Relative: 8.8 %
Neutro Abs: 3618 cells/uL (ref 1500–7800)
Neutrophils Relative %: 64.6 %
Platelets: 314 10*3/uL (ref 140–400)
RBC: 4.77 10*6/uL (ref 4.20–5.80)
RDW: 14.4 % (ref 11.0–15.0)
Total Lymphocyte: 25.4 %
WBC: 5.6 10*3/uL (ref 3.8–10.8)

## 2020-09-27 LAB — HEPATITIS B SURFACE ANTIBODY,QUALITATIVE: Hep B S Ab: REACTIVE — AB

## 2020-09-27 LAB — URINALYSIS, ROUTINE W REFLEX MICROSCOPIC
Bacteria, UA: NONE SEEN /HPF
Bilirubin Urine: NEGATIVE
Glucose, UA: NEGATIVE
Hgb urine dipstick: NEGATIVE
Hyaline Cast: NONE SEEN /LPF
Ketones, ur: NEGATIVE
Nitrite: NEGATIVE
RBC / HPF: NONE SEEN /HPF (ref 0–2)
Specific Gravity, Urine: 1.018 (ref 1.001–1.035)
pH: 6.5 (ref 5.0–8.0)

## 2020-09-27 LAB — LIPID PANEL
Cholesterol: 187 mg/dL (ref <200)
HDL: 73 mg/dL (ref >=40)
LDL Cholesterol (Calc): 101 mg/dL (calc) — ABNORMAL HIGH
Non-HDL Cholesterol (Calc): 114 mg/dL (calc) (ref <130)
Total CHOL/HDL Ratio: 2.6 (calc) (ref <5.0)
Triglycerides: 45 mg/dL (ref <150)

## 2020-09-27 LAB — HEPATITIS C ANTIBODY
Hepatitis C Ab: NONREACTIVE
SIGNAL TO CUT-OFF: 0.15 (ref <1.00)

## 2020-09-27 LAB — RPR: RPR Ser Ql: NONREACTIVE

## 2020-09-27 LAB — HIV-1 RNA QUANT-NO REFLEX-BLD
HIV 1 RNA Quant: 34700 Copies/mL — ABNORMAL HIGH
HIV-1 RNA Quant, Log: 4.54 Log cps/mL — ABNORMAL HIGH

## 2020-09-27 LAB — HEPATITIS B CORE ANTIBODY, TOTAL: Hep B Core Total Ab: NONREACTIVE

## 2020-09-27 LAB — HEPATITIS A ANTIBODY, TOTAL: Hepatitis A AB,Total: REACTIVE — AB

## 2020-10-08 ENCOUNTER — Encounter: Payer: Self-pay | Admitting: Internal Medicine

## 2020-10-21 ENCOUNTER — Ambulatory Visit: Payer: Self-pay

## 2020-10-21 ENCOUNTER — Ambulatory Visit: Payer: Self-pay | Admitting: Internal Medicine

## 2020-10-23 ENCOUNTER — Ambulatory Visit: Payer: Self-pay | Admitting: Internal Medicine

## 2020-10-23 ENCOUNTER — Telehealth: Payer: Self-pay

## 2020-10-23 NOTE — Telephone Encounter (Signed)
Attempted to contact regarding no show/ no answer no vm.

## 2020-11-04 ENCOUNTER — Other Ambulatory Visit: Payer: Self-pay

## 2020-11-04 ENCOUNTER — Emergency Department (HOSPITAL_COMMUNITY)
Admission: EM | Admit: 2020-11-04 | Discharge: 2020-11-04 | Disposition: A | Discharge status: Home / Self Care | Payer: Self-pay | Attending: Emergency Medicine | Admitting: Emergency Medicine

## 2020-11-04 DIAGNOSIS — F17210 Nicotine dependence, cigarettes, uncomplicated: Secondary | ICD-10-CM | POA: Insufficient documentation

## 2020-11-04 DIAGNOSIS — F1610 Hallucinogen abuse, uncomplicated: Secondary | ICD-10-CM | POA: Insufficient documentation

## 2020-11-04 DIAGNOSIS — Z21 Asymptomatic human immunodeficiency virus [HIV] infection status: Secondary | ICD-10-CM | POA: Insufficient documentation

## 2020-11-04 DIAGNOSIS — F1510 Other stimulant abuse, uncomplicated: Secondary | ICD-10-CM | POA: Insufficient documentation

## 2020-11-04 DIAGNOSIS — I10 Essential (primary) hypertension: Secondary | ICD-10-CM | POA: Insufficient documentation

## 2020-11-04 DIAGNOSIS — F1910 Other psychoactive substance abuse, uncomplicated: Secondary | ICD-10-CM

## 2020-11-04 DIAGNOSIS — F1210 Cannabis abuse, uncomplicated: Secondary | ICD-10-CM | POA: Insufficient documentation

## 2020-11-04 DIAGNOSIS — J45909 Unspecified asthma, uncomplicated: Secondary | ICD-10-CM | POA: Insufficient documentation

## 2020-11-04 DIAGNOSIS — Z79899 Other long term (current) drug therapy: Secondary | ICD-10-CM | POA: Insufficient documentation

## 2020-11-04 NOTE — ED Provider Notes (Signed)
[facility]-EMERGENCY DEPT Provider Note   CSN: [id number] Arrival date & time: 11/04/20  1640     History Chief Complaint  Patient presents with   Drug Problem    Dillon Hood is a 45 y.o. male.  Presents to ER with concern for drug problem.  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  Very little alcohol use.  States he is interested in quitting and going through some sort of detox program.  He denies any thoughts of hurting himself or hurting others.  Right now he does not have any medical complaints.  Has had issues with homelessness recently.  Recently staying at a hotel.  HPI     Past Medical History:  Diagnosis Date   Asthma    Bronchitis    HIV positive (HCC)    Hypertension     Patient Active Problem List   Diagnosis Date Noted   Human immunodeficiency virus (HIV) disease (HCC) 09/23/2020   HTN (hypertension) 09/23/2020   Substance abuse (HCC) 09/23/2020   Routine screening for STI (sexually transmitted infection) 09/23/2020   Encounter for long-term (current) use of high-risk medication 09/23/2020    Past Surgical History:  Procedure Laterality Date   HERNIA REPAIR     umbilical   TONSILLECTOMY     WISDOM TOOTH EXTRACTION         No family history on file.  Social History   Tobacco Use   Smoking status: Every Day    Packs/day: 0.25    Years: 25.00    Pack years: 6.25    Types: Cigarettes   Smokeless tobacco: Never  Vaping Use   Vaping Use: Never used  Substance Use Topics   Alcohol use: Not Currently   Drug use: Not Currently    Types: Cocaine, Marijuana, Methamphetamines    Comment: smokes meth and "booty bumps"    Home Medications Prior to Admission medications   Medication Sig Start Date End Date Taking? Authorizing Provider  albuterol (PROVENTIL HFA) 108 (90 Base) MCG/ACT inhaler Inhale 2 puffs into the lungs every 4 (four) hours as needed for wheezing or shortness of breath. 01/02/18   Sharman Cheek, MD  albuterol (PROVENTIL HFA;VENTOLIN HFA) 108 (90 Base) MCG/ACT inhaler Inhale 2 puffs into the lungs every 6 (six) hours as needed for wheezing or shortness of breath. 01/01/18   Phineas Semen, MD  amLODipine (NORVASC) 10 MG tablet Take 10 mg by mouth daily. Patient not taking: Reported on 09/23/2020 08/23/19   [provider]  bictegravir-emtricitabine-tenofovir AF (BIKTARVY) 50-200-25 MG TABS tablet Take 1 tablet by mouth daily. 09/23/20   Kuppelweiser, Cassie L, RPH-CPP  BIKTARVY 50-200-25 MG TABS tablet Take 1 tablet by mouth daily. 09/23/20   Gardiner Barefoot, MD  lisinopril (ZESTRIL) 20 MG tablet Take 20 mg by mouth daily. Patient not taking: Reported on 09/23/2020 08/23/19   [provider]  omeprazole (PRILOSEC) 20 MG capsule Take 1 capsule (20 mg total) by mouth daily. 01/15/20   Derwood Kaplan, MD    Allergies    Macadamia nut oil, Shellfish allergy, and Iodinated diagnostic agents  Review of Systems   Review of Systems  Constitutional:  Negative for chills and fever.  HENT:  Negative for ear pain and sore throat.   Eyes:  Negative for pain and visual disturbance.  Respiratory:  Negative for cough and shortness of breath.   Cardiovascular:  Negative for chest pain and palpitations.  Gastrointestinal:  Negative for abdominal pain and  vomiting.  Genitourinary:  Negative for dysuria and hematuria.  Musculoskeletal:  Negative for arthralgias and back pain.  Skin:  Negative for color change and rash.  Neurological:  Negative for seizures and syncope.  All other systems reviewed and are negative.  Physical Exam Updated Vital Signs BP (!) 148/101 (BP Location: Right Arm)   Pulse 83   Temp 99.1 F (37.3 C) (Oral)   Resp 20   Ht 5\' 10"  (1.778 m)   Wt 99.8 kg   SpO2 100%   BMI 31.57 kg/m   Physical Exam Vitals and nursing note reviewed.  Constitutional:      Appearance: He is well-developed.  HENT:     Head: Normocephalic and atraumatic.  Eyes:      Conjunctiva/sclera: Conjunctivae normal.  Cardiovascular:     Rate and Rhythm: Normal rate and regular rhythm.  Pulmonary:     Effort: Pulmonary effort is normal. No respiratory distress.  Abdominal:     Palpations: Abdomen is soft.     Tenderness: There is no abdominal tenderness.  Musculoskeletal:     Cervical back: Neck supple.  Skin:    General: Skin is warm and dry.  Neurological:     General: No focal deficit present.     Mental Status: He is alert.  Psychiatric:        Mood and Affect: Mood normal.        Behavior: Behavior normal.    ED Results / Procedures / Treatments   Labs (all labs ordered are listed, but only abnormal results are displayed) Labs Reviewed - No data to display  EKG None  Radiology No results found.  Procedures Procedures   Medications Ordered in ED Medications - No data to display  ED Course  I have reviewed the triage vital signs and the nursing notes.  Pertinent labs & imaging results that were available during my care of the patient were reviewed by me and considered in my medical decision making (see chart for details).    MDM Rules/Calculators/A&P                           45 year old gentleman presents to ER requesting detox from meth and weed, ecstasy.  Patient does not have any acute medical complaints at this time.  He appears well in no distress.  He is calm, cooperative.  Do not see any acute psychiatric needs.  Given no medical complaints and no psychiatric complaints, does not feel he needed any further work-up or treatment in ER.  Our case management team discussed local resources for substance abuse treatment, patient provided information and ultimately discharged home.    After the discussed management above, the patient was determined to be safe for discharge.  The patient was in agreement with this plan and all questions regarding their care were answered.  ED return precautions were discussed and the patient will  return to the ED with any significant worsening of condition.  Final Clinical Impression(s) / ED Diagnoses Final diagnoses:  Polysubstance abuse [facility])    Rx / DC Orders ED Discharge Orders     None        [facility], MD 11/06/20 [phone]

## 2020-11-04 NOTE — Discharge Instructions (Addendum)
[facility] [address], [place], [place] [address] Phone: [phone]   Recommend following up with one of the substance abuse facilities that was discussed by the case management team.  If you develop chest pain, vomiting, difficulty breathing or other new concerning symptom, come back to ER for reassessment.

## 2020-11-04 NOTE — ED Triage Notes (Signed)
Pt came from home via POV. Pt states "I need to detox from meth, weed, and MDMA. And I want placement in a facility." Pt states marijuana use is daily, meth use approximately monthly. Pt denies alcohol use. Pt states last meth use was 10/2. Pt states drug use began in 2017. Pt also has a sizeable hematoma on left side of head, but pt states it has been there for over 20 years, denies any recent head trauma or fall.

## 2020-11-04 NOTE — Progress Notes (Signed)
.  Transition of Care [facility]) - Emergency Department Mini Assessment   Patient Details  Name: Dillon Hood MRN: [id number] Date of Birth: [birth year]  Transition of Care [facility]) CM/SW Contact:    Larrie Kass, LCSW Phone Number: 11/04/2020, 6:19 PM   Clinical Narrative:   CSW spoke with pt, he stated he is looking for inpatient substance abuse treatment, pt stated he has tried detox treatment before. Pt is uninsured, CSW informed pt of challenges he will encounter seeking substance treatment, due to lack of Health Care coverage. CSW attached substance abuse resources to pt's AVS. CSW informed pt of [facility] and [facility] )may help uninsured individuals. Pt stated he is trying to leave [facility] and [facility] in [employer] [place] is too close. CSW suggested pt to call both centers for bed availability. pt stated he will try and contact the detox center in [place]. Pt stated he does have family members that can assist with transporting him there. Pt stated he did not need any transportation help upon d/c as he drove himself to the ER.   Valentina Shaggy.Piper Hassebrock, MSW, LCSWA [facility]  Transitions of Care Clinical Social Worker I Direct Dial: [phone]  Fax: [phone] [email or web address]   ED Mini Assessment: What brought you to the Emergency Department? : detox placemnt  Barriers to Discharge: No Barriers Identified, Continued Medical Work up     Means of departure: Car  Interventions which prevented an admission or readmission: Transportation Screening, Homeless Screening, Patient counseling    Patient Contact and Communications        ,                 Admission diagnosis:  Detox Patient Active Problem List   Diagnosis Date Noted   Human immunodeficiency virus (HIV) disease (HCC) 09/23/2020   HTN (hypertension) 09/23/2020   Substance abuse (HCC) 09/23/2020    Routine screening for STI (sexually transmitted infection) 09/23/2020   Encounter for long-term (current) use of high-risk medication 09/23/2020   PCP:  [facility] Pharmacy:   [id number] [place], [place] - [address] AT [facility] OF [facility] & [address] [place] [address] Phone: [phone] Fax: [phone]  [place], [place] - [address] AT [facility] [address] [place] [address] Phone: [phone] Fax: [phone]  [facility] DR[facility]ORE #[id number] [place] Otto, [place] - [address] AT [facility] OF [address] [place] [address] Phone: [phone] Fax: [phone]

## 2020-11-17 ENCOUNTER — Ambulatory Visit: Payer: Self-pay | Admitting: Internal Medicine

## 2021-02-12 ENCOUNTER — Telehealth: Payer: Self-pay

## 2021-02-12 NOTE — Telephone Encounter (Signed)
Called patient to offer overdue appointment, calls could not be completed.    Sandie Ano, RN

## 2021-11-29 ENCOUNTER — Other Ambulatory Visit: Payer: Self-pay | Admitting: Internal Medicine

## 2022-02-16 IMAGING — CT CT HEAD W/O CM
3 of 6 series · 15 of 47 positions shown, 18 images · non-contrast
Comparison: No pertinent prior exams available for comparison.

CLINICAL DATA: Seizure, nontraumatic. Additional history provided:
Patient reports nausea.

EXAM:
CT HEAD WITHOUT CONTRAST
TECHNIQUE: Contiguous axial images were obtained from the base of the skull
through the vertex without intravenous contrast.

[Series 2: head wo · axial · 0.48mm/px · z∈[-97,+33]mm · 10 of 32 slices shown, 13 images]
[im 3/32  brain]
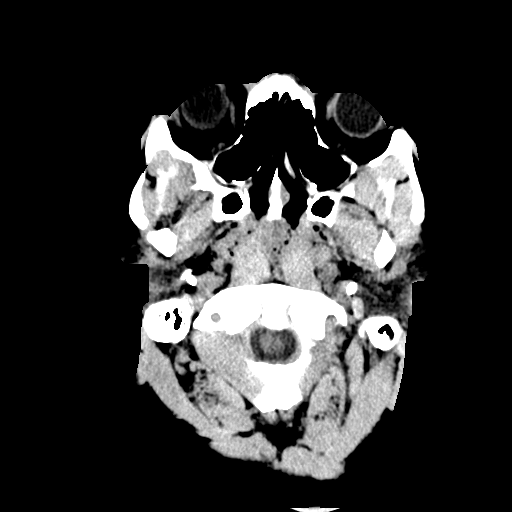
[im 3/32  bone]
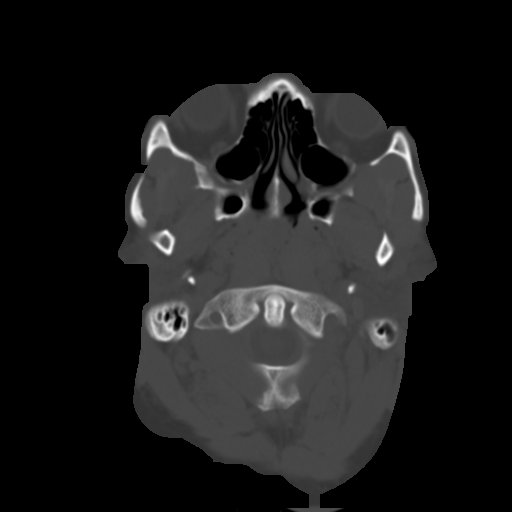
[im 6/32  brain]
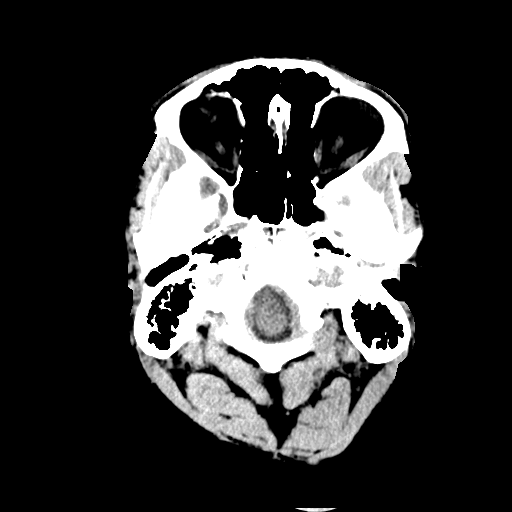
[im 9/32  brain]
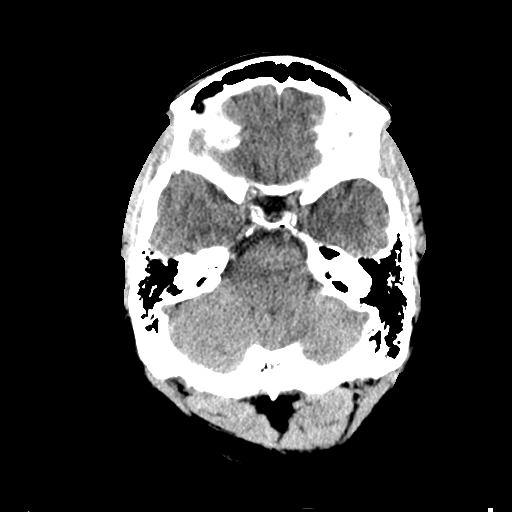
[im 11/32  brain]
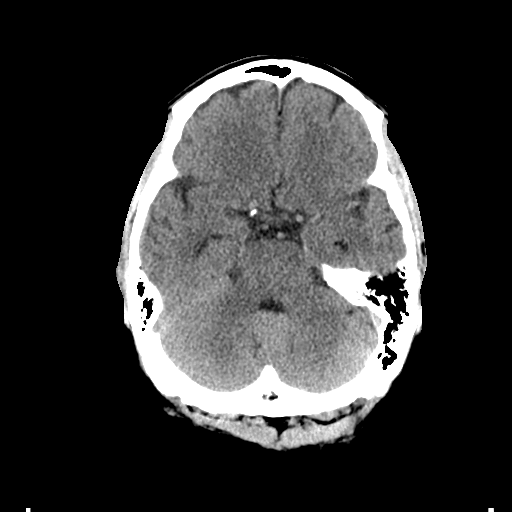
[im 14/32  brain]
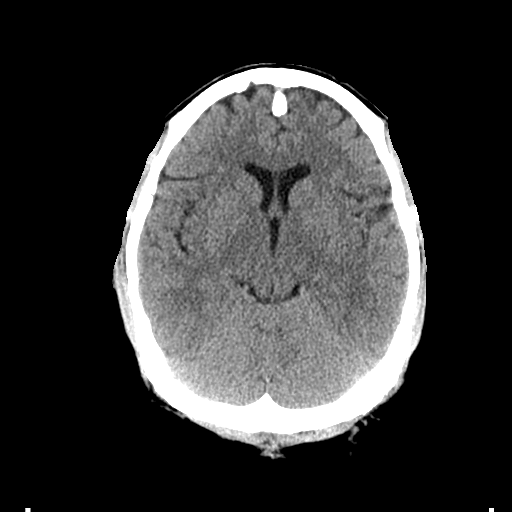
[im 14/32  bone]
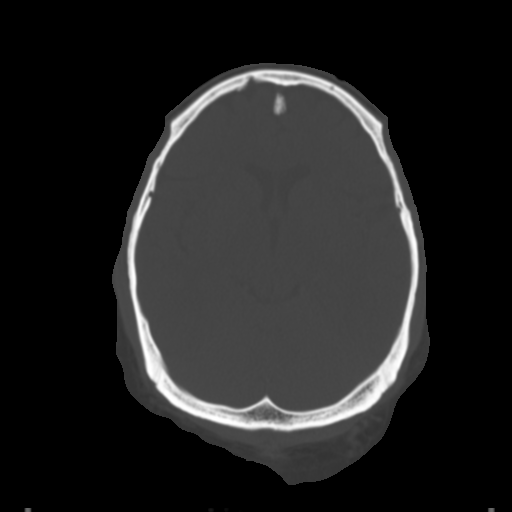
[im 18/32  brain]
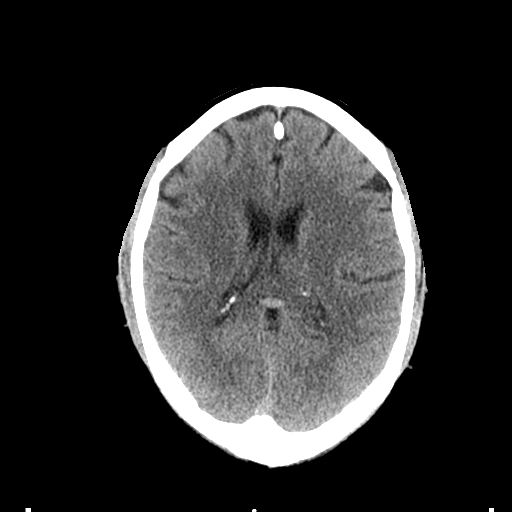
[im 21/32  brain]
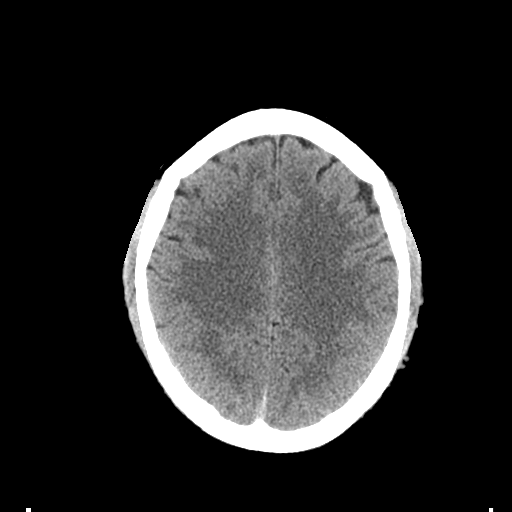
[im 23/32  brain]
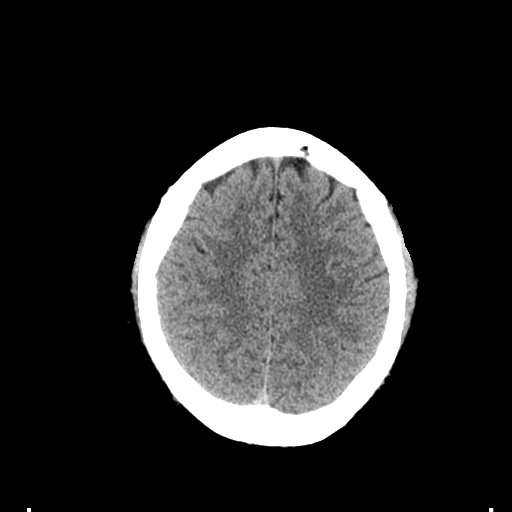
[im 26/32  brain]
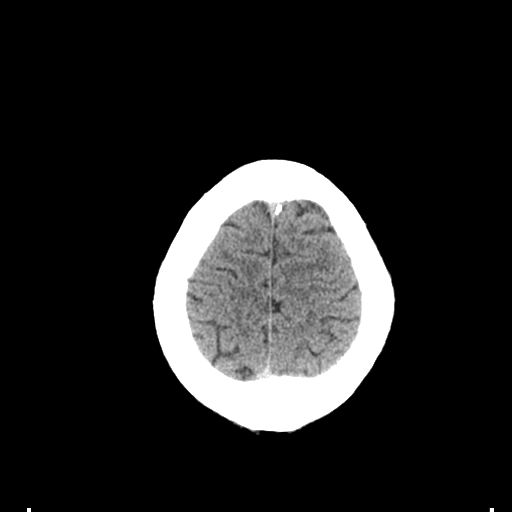
[im 26/32  bone]
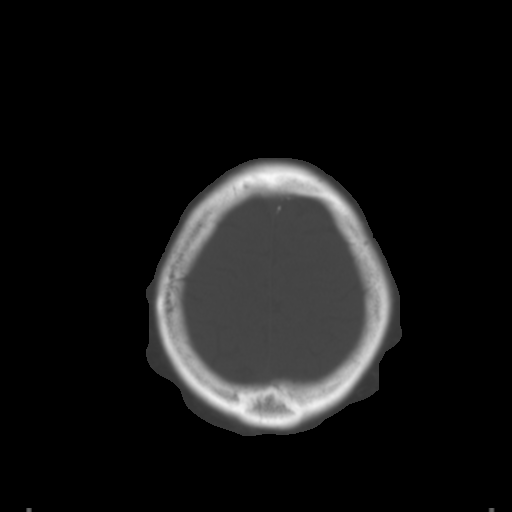
[im 29/32  brain]
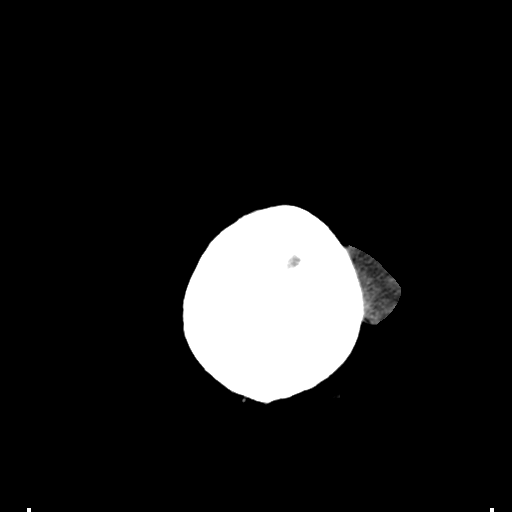

[Series 4: coronal soft tissue · coronal · 0.34mm/px · 3 of 76 slices shown]
[im 16/76  brain]
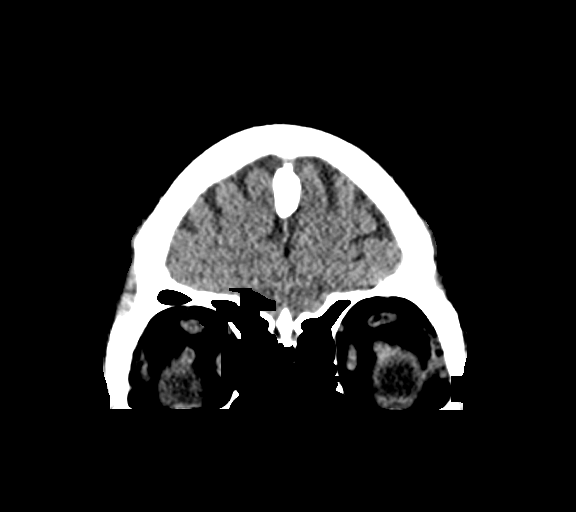
[im 31/76  brain]
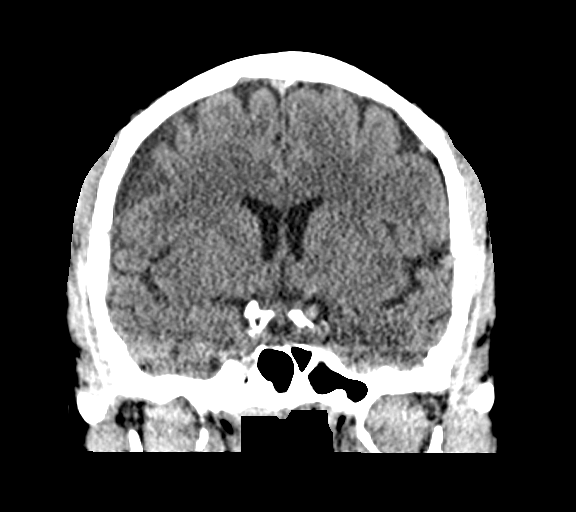
[im 46/76  brain]
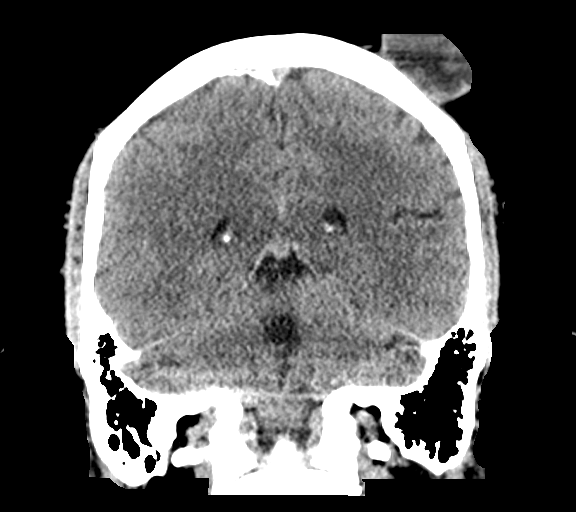

[Series 5: sagittal soft tissue · sagittal · 0.37mm/px · 2 of 62 slices shown]
[im 21/62  brain]
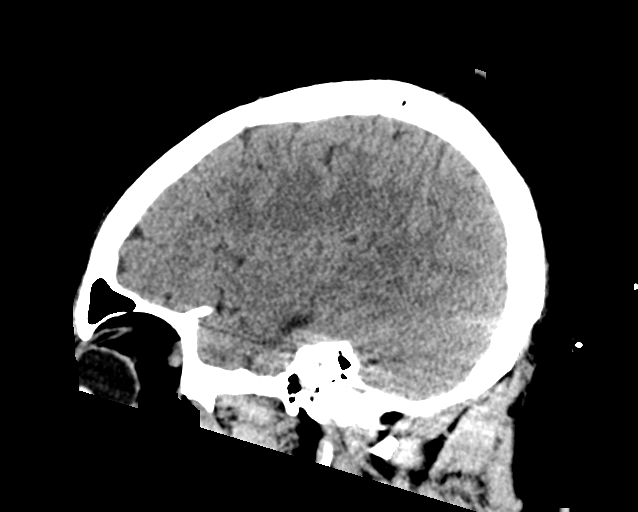
[im 41/62  brain]
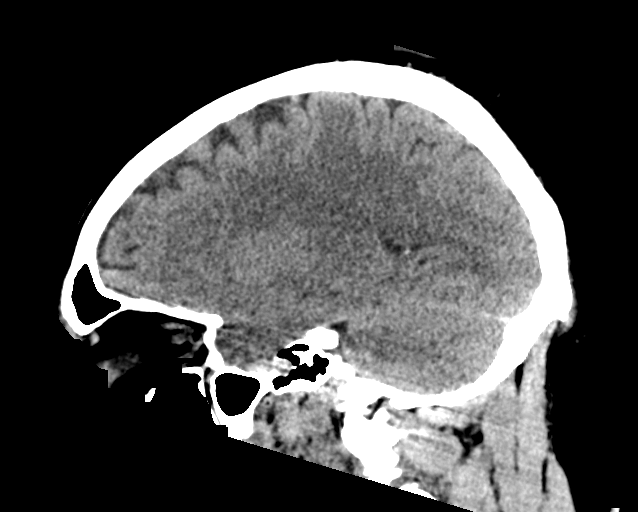

[15 of 47 positions shown; findings below may reference images not displayed]

FINDINGS: Brain:

Cerebral volume is normal for age.

There is no acute intracranial hemorrhage.

No demarcated cortical infarct.

No extra-axial fluid collection.

No evidence of intracranial mass.

No midline shift.

Vascular: No hyperdense vessel.

Skull: Normal. Negative for fracture or focal lesion. 1.4 cm
exostosis arising from the midline occipital calvarium.

Sinuses/Orbits: Visualized orbits show no acute finding. No
significant paranasal sinus disease at the imaged levels.

Other: Nonspecific 4.4 x 3.5 cm low attenuation lesion within the
left parietal scalp.
IMPRESSION: No evidence of acute intracranial abnormality.

4.4 cm low-attenuation lesion within the left parietal scalp. This
could reflect a large sebaceous cyst, but is nonspecific and
incompletely characterized on this non-contrast exam. Direct
visualization is recommended. Additionally, non-emergent CT imaging
with contrast can be considered for further characterization.

1.4 cm exostosis projecting from the midline occipital calvarium.

## 2022-12-20 ENCOUNTER — Other Ambulatory Visit: Payer: Self-pay | Admitting: General Surgery

## 2022-12-20 DIAGNOSIS — K432 Incisional hernia without obstruction or gangrene: Secondary | ICD-10-CM

## 2022-12-27 ENCOUNTER — Ambulatory Visit
Admission: RE | Admit: 2022-12-27 | Discharge: 2022-12-27 | Disposition: A | Discharge status: Home / Self Care | Payer: BC Managed Care – PPO | Source: Ambulatory Visit | Attending: General Surgery

## 2022-12-27 DIAGNOSIS — K432 Incisional hernia without obstruction or gangrene: Secondary | ICD-10-CM

## 2023-01-05 ENCOUNTER — Encounter: Payer: Self-pay | Admitting: Gastroenterology

## 2023-01-31 ENCOUNTER — Telehealth: Payer: Self-pay

## 2023-01-31 ENCOUNTER — Ambulatory Visit (AMBULATORY_SURGERY_CENTER): Payer: BC Managed Care – PPO

## 2023-01-31 ENCOUNTER — Other Ambulatory Visit: Payer: Self-pay | Admitting: *Deleted

## 2023-01-31 VITALS — Ht 70.0 in | Wt 360.0 lb

## 2023-01-31 DIAGNOSIS — Z1211 Encounter for screening for malignant neoplasm of colon: Secondary | ICD-10-CM

## 2023-01-31 MED ORDER — NA SULFATE-K SULFATE-MG SULF 17.5-3.13-1.6 GM/177ML PO SOLN: 1.0000 | Freq: Once | ORAL | 0 refills | Status: AC

## 2023-01-31 NOTE — Telephone Encounter (Signed)
Patient has been scheduled at [facility] for colonoscopy on 03/22/23 at 11:00 am; 930 am arrival.

## 2023-01-31 NOTE — Progress Notes (Signed)

## 2023-01-31 NOTE — Telephone Encounter (Signed)
Patient called and notified of colonoscopy on 03/22/23 at 1100 with 0930 arrival at [facility].  He verbalized understanding.  New prep instructions mailed to patient. SChaplin, RN,BSN

## 2023-01-31 NOTE — Progress Notes (Signed)
While speaking with patient during his previsit appointment, he informed RN that he has gained a lot of weight and his current weight is 360lbs, 5'10.  This puts his current BMI 51.65.  RN asked patient if he would like to come in and get weighed, he denies and states he is certain his BMI is over 50.  Denies using Wegov[employer].   Will forward to Vernia Buff so he can be rescheduled at the hospital.  Previsit appt was completed today, other than prep instructions d/t rescheduling. Colonoscopy at [facility] cancelled. SChaplin, RN,BSN

## 2023-02-17 ENCOUNTER — Encounter: Payer: BC Managed Care – PPO | Admitting: Gastroenterology

## 2023-02-21 ENCOUNTER — Encounter (HOSPITAL_COMMUNITY): Payer: Self-pay | Admitting: *Deleted

## 2023-02-21 ENCOUNTER — Ambulatory Visit (INDEPENDENT_AMBULATORY_CARE_PROVIDER_SITE_OTHER): Payer: BC Managed Care – PPO

## 2023-02-21 ENCOUNTER — Ambulatory Visit (HOSPITAL_COMMUNITY)
Admission: EM | Admit: 2023-02-21 | Discharge: 2023-02-21 | Disposition: A | Discharge status: Home / Self Care | Payer: BC Managed Care – PPO | Attending: Family Medicine | Admitting: Family Medicine

## 2023-02-21 DIAGNOSIS — J069 Acute upper respiratory infection, unspecified: Secondary | ICD-10-CM | POA: Diagnosis not present

## 2023-02-21 DIAGNOSIS — R0602 Shortness of breath: Secondary | ICD-10-CM | POA: Diagnosis not present

## 2023-02-21 LAB — POC COVID19/FLU A&B COMBO
Covid Antigen, POC: NEGATIVE
Influenza A Antigen, POC: NEGATIVE
Influenza B Antigen, POC: NEGATIVE

## 2023-02-21 MED ORDER — PREDNISONE 20 MG PO TABS: 40.0000 mg | ORAL_TABLET | Freq: Every day | ORAL | 0 refills | Status: AC

## 2023-02-21 NOTE — ED Triage Notes (Signed)
Pt states he has cough and congestion X 3 days. He has been taking mucinex and using his albuterol MDI.

## 2023-02-21 NOTE — ED Provider Notes (Signed)
[facility]    CSN: [id number] Arrival date & time: 02/21/23  0809      History   Chief Complaint Chief Complaint  Patient presents with   Cough   Nasal Congestion    HPI Dillon Hood is a 48 y.o. male.   Patient with history of HIV, well-controlled on antivirals, presenting with cough, shortness of breath and wheezing.  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  Patient also notes that when he goes home they are working on the sheet rock so there is also dust and particles at home.  Patient used to smoke but quit.  Patient denies any fevers and chills,   Cough   Past Medical History:  Diagnosis Date   Asthma    Bronchitis    HIV positive (HCC)    Hypertension     Patient Active Problem List   Diagnosis Date Noted   Human immunodeficiency virus (HIV) disease (HCC) 09/23/2020   HTN (hypertension) 09/23/2020   Substance abuse (HCC) 09/23/2020   Routine screening for STI (sexually transmitted infection) 09/23/2020   Encounter for long-term (current) use of high-risk medication 09/23/2020    Past Surgical History:  Procedure Laterality Date   HERNIA REPAIR     umbilical   TONSILLECTOMY     WISDOM TOOTH EXTRACTION         Home Medications    Prior to Admission medications   Medication Sig Start Date End Date Taking? Authorizing Provider  albuterol (PROVENTIL HFA) 108 (90 Base) MCG/ACT inhaler Inhale 2 puffs into the lungs every 4 (four) hours as needed for wheezing or shortness of breath. 01/02/18  Yes Sharman Cheek, MD  amLODipine (NORVASC) 10 MG tablet Take 10 mg by mouth daily. 08/23/19  Yes [provider]  bictegravir-emtricitabine-tenofovir AF (BIKTARVY) 50-200-25 MG TABS tablet Take 1 tablet by mouth daily. 09/23/20  Yes Kuppelweiser, Cassie L, RPH-CPP  lisinopril (ZESTRIL) 20 MG tablet Take 20 mg by mouth daily. 08/23/19  Yes [provider]   predniSONE (DELTASONE) 20 MG tablet Take 2 tablets (40 mg total) by mouth daily for 7 days. 02/21/23 02/28/23 Yes Brenton Grills, MD  rosuvastatin (CRESTOR) 5 MG tablet Take 5 mg by mouth daily. 01/27/23  Yes [provider]  albuterol (PROVENTIL HFA;VENTOLIN HFA) 108 (90 Base) MCG/ACT inhaler Inhale 2 puffs into the lungs every 6 (six) hours as needed for wheezing or shortness of breath. 01/01/18   Phineas Semen, MD  BIKTARVY 50-200-25 MG TABS tablet Take 1 tablet by mouth daily. 09/23/20   Gardiner Barefoot, MD  ibuprofen (ADVIL) 600 MG tablet Take 600 mg by mouth every 8 (eight) hours as needed. 08/02/17   [provider]  nicotine (NICODERM CQ - DOSED IN MG/24 HOURS) 14 mg/24hr patch Place 14 mg onto the skin daily. 12/13/22   [provider]  omeprazole (PRILOSEC) 20 MG capsule Take 1 capsule (20 mg total) by mouth daily. Patient not taking: Reported on 01/31/2023 01/15/20   Derwood Kaplan, MD    Family History Family History  Problem Relation Age of Onset   Colon cancer Father    Rectal cancer Neg Hx    Stomach cancer Neg Hx     Social History Social History   Tobacco Use   Smoking status: Former    Current packs/day: 0.25    Average packs/day: 0.3 packs/day for 25.0 years (6.3 ttl pk-yrs)  Types: Cigarettes   Smokeless tobacco: Never  Vaping Use   Vaping status: Never Used  Substance Use Topics   Alcohol use: Not Currently   Drug use: Not Currently    Types: Cocaine, Marijuana, Methamphetamines    Comment: 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     Allergies   Iodine, Macadamia nut oil, Shellfish allergy, and Iodinated contrast media   Review of Systems Review of Systems  Respiratory:  Positive for cough.      Physical Exam Triage Vital Signs ED Triage Vitals  Encounter Vitals Group     BP 02/21/23 0830 (!) 159/97     Systolic BP Percentile --      Diastolic BP Percentile --      Pulse Rate 02/21/23 0830 90      Resp 02/21/23 0830 20     Temp 02/21/23 0830 98.4 F (36.9 C)     Temp Source 02/21/23 0830 Oral     SpO2 02/21/23 0830 94 %     Weight --      Height --      Head Circumference --      Peak Flow --      Pain Score 02/21/23 0828 0     Pain Loc --      Pain Education --      Exclude from Growth Chart --    No data found.  Updated Vital Signs BP (!) 159/97 (BP Location: Left Arm)   Pulse 90   Temp 98.4 F (36.9 C) (Oral)   Resp 20   SpO2 94%   Visual Acuity Right Eye Distance:   Left Eye Distance:   Bilateral Distance:    Right Eye Near:   Left Eye Near:    Bilateral Near:     Physical Exam Constitutional:      Appearance: Normal appearance.  HENT:     Nose: Congestion and rhinorrhea present.     Mouth/Throat:     Pharynx: No oropharyngeal exudate or posterior oropharyngeal erythema.  Eyes:     General:        Right eye: No discharge.        Left eye: No discharge.     Extraocular Movements: Extraocular movements intact.     Conjunctiva/sclera: Conjunctivae normal.     Pupils: Pupils are equal, round, and reactive to light.  Cardiovascular:     Rate and Rhythm: Normal rate.  Pulmonary:     Effort: Pulmonary effort is normal. No respiratory distress.     Breath sounds: No stridor. Wheezing present. No rales.  Chest:     Chest wall: No tenderness.  Neurological:     Mental Status: He is alert.      UC Treatments / Results  Labs (all labs ordered are listed, but only abnormal results are displayed) Labs Reviewed  POC COVID19/FLU A&B COMBO    EKG   Radiology DG Chest 2 View Result Date: 02/21/2023 CLINICAL DATA:  Shortness of breath and congestion EXAM: CHEST - 2 VIEW COMPARISON:  01/15/2020 FINDINGS: Normal heart size. Slightly lower lung volumes with minor vascular and interstitial prominence, nonspecific. No definite focal pneumonia, collapse or consolidation. Negative for CHF, effusion or pneumothorax. Trachea midline. No osseous abnormality.  IMPRESSION: Low volume exam with nonspecific vascular and interstitial prominence. Electronically Signed   By: Judie Petit.  Shick M.D.   On: 02/21/2023 09:19    Procedures Procedures (including critical care time)  Medications Ordered in UC Medications -  No data to display  Initial Impression / Assessment and Plan / UC Course  I have reviewed the triage vital signs and the nursing notes.  Pertinent labs & imaging results that were available during my care of the patient were reviewed by me and considered in my medical decision making (see chart for details).     Patient with history of HIV, well-controlled on antivirals, presenting with some cough, wheezing and shortness of breath.  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  Chest x-ray is unremarkable but does show possible interstitial prominence in the lower lung volumes.  Given patient's wheezing, shortness of breath as well as recent exposure, we will go ahead and treat patient with prednisone for the next 7 days.  Patient advised to follow-up if he develops any fevers or chills. Final Clinical Impressions(s) / UC Diagnoses   Final diagnoses:  Viral upper respiratory tract infection     Discharge Instructions      Your x-ray shows no infection at this time, if you feel like you are developing fevers or chills despite taking the steroid.  Please come back and see [place] immediately.     ED Prescriptions     Medication Sig Dispense Auth. Provider   predniSONE (DELTASONE) 20 MG tablet Take 2 tablets (40 mg total) by mouth daily for 7 days. 14 tablet Brenton Grills, MD      PDMP not reviewed this encounter.   Brenton Grills, MD 02/21/23 [phone]

## 2023-02-21 NOTE — Discharge Instructions (Signed)
Your x-ray shows no infection at this time, if you feel like you are developing fevers or chills despite taking the steroid.  Please come back and see [place] immediately.

## 2023-03-01 ENCOUNTER — Encounter (HOSPITAL_COMMUNITY): Payer: Self-pay | Admitting: *Deleted

## 2023-03-01 ENCOUNTER — Ambulatory Visit (HOSPITAL_COMMUNITY)
Admission: EM | Admit: 2023-03-01 | Discharge: 2023-03-01 | Disposition: A | Discharge status: Home / Self Care | Payer: BC Managed Care – PPO | Attending: Emergency Medicine | Admitting: Emergency Medicine

## 2023-03-01 DIAGNOSIS — J0100 Acute maxillary sinusitis, unspecified: Secondary | ICD-10-CM | POA: Diagnosis not present

## 2023-03-01 DIAGNOSIS — R519 Headache, unspecified: Secondary | ICD-10-CM

## 2023-03-01 MED ORDER — KETOROLAC TROMETHAMINE 30 MG/ML IJ SOLN
INTRAMUSCULAR | Status: AC
  Filled 2023-03-01: qty 1

## 2023-03-01 MED ORDER — ONDANSETRON 4 MG PO TBDP
ORAL_TABLET | ORAL | Status: AC
  Filled 2023-03-01: qty 1

## 2023-03-01 MED ORDER — AMOXICILLIN-POT CLAVULANATE 875-125 MG PO TABS: 1.0000 | ORAL_TABLET | Freq: Two times a day (BID) | ORAL | 0 refills | Status: AC

## 2023-03-01 MED ORDER — DEXAMETHASONE SODIUM PHOSPHATE 10 MG/ML IJ SOLN
10.0000 mg | Freq: Once | INTRAMUSCULAR | Status: AC
  Administered 2023-03-01: 10 mg via INTRAMUSCULAR

## 2023-03-01 MED ORDER — KETOROLAC TROMETHAMINE 30 MG/ML IJ SOLN
30.0000 mg | Freq: Once | INTRAMUSCULAR | Status: AC
  Administered 2023-03-01: 30 mg via INTRAMUSCULAR

## 2023-03-01 MED ORDER — DEXAMETHASONE SODIUM PHOSPHATE 10 MG/ML IJ SOLN
INTRAMUSCULAR | Status: AC
Start: 2023-03-01 — End: ?
  Filled 2023-03-01: qty 1

## 2023-03-01 MED ORDER — ONDANSETRON 4 MG PO TBDP
4.0000 mg | ORAL_TABLET | Freq: Once | ORAL | Status: AC
  Administered 2023-03-01: 4 mg via ORAL

## 2023-03-01 NOTE — ED Provider Notes (Signed)
[facility]    CSN: [id number] Arrival date & time: 03/01/23  1636      History   Chief Complaint Chief Complaint  Patient presents with   Headache    HPI Dillon Hood is a 48 y.o. male.   Patient presents with right sided headache that began around 9am this morning. Patient states he has never had a headache this bad before, but rates 6 out of 10. Patient endorses some mild nausea and lightheadedness. Hx of HTN. States he did take his blood pressure medication today.    Headache Associated symptoms: congestion, cough, nausea and sinus pressure   Associated symptoms: no abdominal pain, no diarrhea, no dizziness, no fatigue, no fever, no numbness, no photophobia, no vomiting and no weakness     Past Medical History:  Diagnosis Date   Asthma    Bronchitis    HIV positive (HCC)    Hypertension     Patient Active Problem List   Diagnosis Date Noted   Human immunodeficiency virus (HIV) disease (HCC) 09/23/2020   HTN (hypertension) 09/23/2020   Substance abuse (HCC) 09/23/2020   Routine screening for STI (sexually transmitted infection) 09/23/2020   Encounter for long-term (current) use of high-risk medication 09/23/2020    Past Surgical History:  Procedure Laterality Date   HERNIA REPAIR     umbilical   TONSILLECTOMY     WISDOM TOOTH EXTRACTION         Home Medications    Prior to Admission medications   Medication Sig Start Date End Date Taking? Authorizing Provider  amLODipine (NORVASC) 10 MG tablet Take 10 mg by mouth daily. 08/23/19  Yes [provider]  amoxicillin-clavulanate (AUGMENTIN) 875-125 MG tablet Take 1 tablet by mouth every 12 (twelve) hours. 03/01/23  Yes Brighton Pilley A, NP  BIKTARVY 50-200-25 MG TABS tablet Take 1 tablet by mouth daily. 09/23/20  Yes Comer, Belia Heman, MD  nicotine (NICODERM CQ - DOSED IN MG/24 HOURS) 14 mg/24hr patch Place 14 mg onto the skin daily. 12/13/22  Yes [provider]  rosuvastatin  (CRESTOR) 5 MG tablet Take 5 mg by mouth daily. 01/27/23  Yes [provider]  albuterol (PROVENTIL HFA) 108 (90 Base) MCG/ACT inhaler Inhale 2 puffs into the lungs every 4 (four) hours as needed for wheezing or shortness of breath. 01/02/18   Sharman Cheek, MD  ibuprofen (ADVIL) 600 MG tablet Take 600 mg by mouth every 8 (eight) hours as needed. 08/02/17   [provider]  lisinopril (ZESTRIL) 20 MG tablet Take 20 mg by mouth daily. 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  HENT:  Positive for congestion, rhinorrhea, sinus pressure and sinus pain.   Eyes:  Negative for photophobia and visual disturbance.  Respiratory:  Positive for cough. Negative for chest tightness, shortness of breath and wheezing.   Cardiovascular:  Negative for chest pain.  Gastrointestinal:  Positive for nausea. Negative for abdominal pain, diarrhea and vomiting.  Neurological:  Positive for light-headedness and headaches. Negative for dizziness, tremors, facial asymmetry, speech difficulty, weakness and numbness.  Psychiatric/Behavioral:  Negative for confusion.      Physical Exam Triage Vital Signs ED Triage Vitals   Encounter Vitals Group     BP 03/01/23 1752 (!) 169/107     Systolic BP Percentile --      Diastolic BP Percentile --      Pulse Rate 03/01/23 1752 88     Resp 03/01/23 1752 18     Temp 03/01/23 1752 98.3 F (36.8 C)     Temp Source 03/01/23 1752 Oral     SpO2 03/01/23 1752 99 %     Weight --      Height --      Head Circumference --      Peak Flow --      Pain Score 03/01/23 1750 10     Pain Loc --      Pain Education --      Exclude from Growth Chart --    No data found.  Updated Vital Signs BP (!) 169/107 (BP Location: Right Arm)   Pulse 88   Temp 98.3 F (36.8 C) (Oral)   Resp 18   SpO2 99%   Visual Acuity Right Eye Distance:   Left Eye Distance:   Bilateral Distance:    Right Eye Near:   Left Eye Near:    Bilateral Near:     Physical Exam Vitals and nursing note reviewed.  Constitutional:      General: He is awake. He is not in acute distress.    Appearance: Normal appearance. He is well-developed and well-groomed. He is not ill-appearing.  HENT:     Right Ear: Tympanic membrane, ear canal and external ear normal.     Left Ear: Tympanic membrane, ear canal and external ear normal.     Nose: Congestion and rhinorrhea present.     Right Sinus: Maxillary sinus tenderness present.     Left Sinus: Maxillary sinus tenderness present.     Comments: Mild tenderness noted to left maxillary sinus and moderate tenderness noted to right maxillary sinus.     Mouth/Throat:     Mouth: Mucous membranes are moist.     Pharynx: Posterior oropharyngeal erythema and postnasal drip present. No oropharyngeal exudate.     Tonsils: No tonsillar exudate.  Eyes:     Extraocular Movements: Extraocular movements intact.     Pupils: Pupils are equal, round, and reactive to light.  Cardiovascular:     Rate and Rhythm: Normal rate and regular rhythm.  Pulmonary:     Effort: Pulmonary effort is normal.     Breath sounds: Normal breath sounds.  Musculoskeletal:        General:  Normal range of motion.     Cervical back: Normal range of motion and neck supple.  Skin:    General: Skin is warm and dry.  Neurological:     Mental Status: He is alert and oriented to person, place, and time. Mental status is at baseline.     GCS: GCS eye subscore is  4. GCS verbal subscore is 5. GCS motor subscore is 6.     Cranial Nerves: Cranial nerves 2-12 are intact.     Sensory: Sensation is intact.     Motor: Motor function is intact.     Coordination: Coordination is intact.     Gait: Gait is intact.  Psychiatric:        Attention and Perception: Attention and perception normal.        Mood and Affect: Mood and affect normal.        Speech: Speech normal.        Behavior: Behavior normal. Behavior is cooperative.        Thought Content: Thought content normal.        Cognition and Memory: Cognition and memory normal.        Judgment: Judgment normal.      UC Treatments / Results  Labs (all labs ordered are listed, but only abnormal results are displayed) Labs Reviewed - No data to display  EKG   Radiology No results found.  Procedures Procedures (including critical care time)  Medications Ordered in UC Medications  ketorolac (TORADOL) 30 MG/ML injection 30 mg (30 mg Intramuscular Given 03/01/23 1820)  ondansetron (ZOFRAN-ODT) disintegrating tablet 4 mg (4 mg Oral Given 03/01/23 1820)  dexamethasone (DECADRON) injection 10 mg (10 mg Intramuscular Given 03/01/23 1819)    Initial Impression / Assessment and Plan / UC Course  I have reviewed the triage vital signs and the nursing notes.  Pertinent labs & imaging results that were available during my care of the patient were reviewed by me and considered in my medical decision making (see chart for details).     Patient presented with right sided headache that began around 9am this morning with mild nausea and lightheadedness. Patient reported that he has never had a headache this bad before, but rates it 6 out of  10. History of hypertension and reports taking blood pressure medication today.   Patient reports he was seen here last week for URI and has had continued congestion and mild cough. Patient reports noticing thick yellow/green mucus.  Upon assessment congestion and rhinorrhea are present. Patient endorses tenderness to maxillary sinuses with the right side being more tender. No neurodeficits noted on exam. GCS 15. EOMI and PERRLA. Patient hypertensive at 169/107. Denies any related symptoms.  Given IM Toradol, IM Decadron, and Zofran ODT for headache. Prescribed Augmentin for sinusitis. Discussed return and strict ER precautions.  Final Clinical Impressions(s) / UC Diagnoses   Final diagnoses:  Bad headache  Acute non-recurrent maxillary sinusitis     Discharge Instructions      Start taking Augmentin twice daily for 7 days. You can continue to take Mucinex for your symptoms. Alternate between Tylenol and Ibuprofen for headache as needed. If you headache does not improve after receiving medication here today or becomes worse, if you develop weakness, numbness, slurred speech, or confusion please seek immediate medical treatment in the ER.     ED Prescriptions     Medication Sig Dispense Auth. Provider   amoxicillin-clavulanate (AUGMENTIN) 875-125 MG tablet Take 1 tablet by mouth every 12 (twelve) hours. 14 tablet Wynonia Lawman A, NP      PDMP not reviewed this encounter.   Wynonia Lawman A, NP 03/01/23 [id number]

## 2023-03-01 NOTE — Discharge Instructions (Signed)
Start taking Augmentin twice daily for 7 days. You can continue to take Mucinex for your symptoms. Alternate between Tylenol and Ibuprofen for headache as needed. If you headache does not improve after receiving medication here today or becomes worse, if you develop weakness, numbness, slurred speech, or confusion please seek immediate medical treatment in the ER.

## 2023-03-01 NOTE — ED Triage Notes (Addendum)
Pt states he has a headache started toady only on the right side. He hasn't taken any meds.   States worse headache he has ever had, pain radiates down into the right side of his face.

## 2023-03-15 ENCOUNTER — Encounter (HOSPITAL_COMMUNITY): Payer: Self-pay | Admitting: Gastroenterology

## 2023-03-15 ENCOUNTER — Encounter: Payer: Self-pay | Admitting: Gastroenterology

## 2023-03-16 ENCOUNTER — Telehealth: Payer: Self-pay | Admitting: Gastroenterology

## 2023-03-16 NOTE — Telephone Encounter (Signed)
Spoke to patient. He has changed his colonoscopy to 04/21/23 at 1015 am, 845 am arrival. He verbalizes understanding of updated appointment time/date/location and says he will update his prep instructions accordingly.

## 2023-03-16 NOTE — Telephone Encounter (Signed)
Inbound call from patient, states he is having transportation issues and would like to reschedule procedure scheduled for 2/18.

## 2023-04-13 ENCOUNTER — Telehealth: Payer: Self-pay | Admitting: Gastroenterology

## 2023-04-13 NOTE — Telephone Encounter (Signed)
 Procedure:Colonoscopy Procedure date: 04/21/23 Procedure location: [facility] Arrival Time: 8:45 am Spoke with the patient Y/N: Yes 3/12/254 @ 4:00 pm Any prep concerns? No  Has the patient obtained the prep from the pharmacy ? ___ Do you have a care partner and transportation: ___ Any additional concerns?   Patient stated he is having some difficulties and need to reschedule procedure

## 2023-04-14 ENCOUNTER — Encounter (HOSPITAL_COMMUNITY): Payer: Self-pay | Admitting: Gastroenterology

## 2023-04-14 NOTE — Telephone Encounter (Addendum)
 Pt was contacted in regard to message below.  Pt stated that he needed to be rescheduled due to personal issues. Pt was rescheduled for his procedure to 05/30/2023 at 9:00 AM with Dr. Barron Alvine at [facility]. Pt made aware. Prep instructions were changed for pt and sent to pt via mail.  Pt verbalized understanding with all questions answered.   Routed as FYI. This pt has rescheduled several time before.

## 2023-05-23 ENCOUNTER — Other Ambulatory Visit: Payer: Self-pay

## 2023-05-23 ENCOUNTER — Telehealth: Payer: Self-pay | Admitting: Gastroenterology

## 2023-05-23 DIAGNOSIS — Z1211 Encounter for screening for malignant neoplasm of colon: Secondary | ICD-10-CM

## 2023-05-23 MED ORDER — NA SULFATE-K SULFATE-MG SULF 17.5-3.13-1.6 GM/177ML PO SOLN
1.0000 | Freq: Once | ORAL | 0 refills | Status: DC
Start: 2023-05-23 — End: 2023-05-23

## 2023-05-23 MED ORDER — NA SULFATE-K SULFATE-MG SULF 17.5-3.13-1.6 GM/177ML PO SOLN: 1.0000 | Freq: Once | ORAL | 0 refills | Status: AC

## 2023-05-23 NOTE — Telephone Encounter (Signed)
 Inbound call from patient, states his prep medication has not been sent to his pharmacy. Would like it sent for his procedure on 4/28.

## 2023-05-23 NOTE — Telephone Encounter (Signed)
 Rx sent to [facility] at the [address] of [address] pt made aware

## 2023-05-24 ENCOUNTER — Telehealth: Payer: Self-pay

## 2023-05-24 ENCOUNTER — Encounter (HOSPITAL_COMMUNITY): Payer: Self-pay | Admitting: Gastroenterology

## 2023-05-24 NOTE — Telephone Encounter (Signed)
 Procedure:colon Procedure date: 05/30/23 Procedure location: [facility] Arrival Time: 730am Spoke with the patient Y/N: y Any prep concerns? n  Has the patient obtained the prep from the pharmacy ? No he states he will be picking that up today. Do you have a care partner and transportation: y Any additional concerns? n

## 2023-05-30 ENCOUNTER — Other Ambulatory Visit: Payer: Self-pay

## 2023-05-30 ENCOUNTER — Ambulatory Visit (HOSPITAL_COMMUNITY): Admitting: Anesthesiology

## 2023-05-30 ENCOUNTER — Ambulatory Visit (HOSPITAL_COMMUNITY)
Admission: RE | Admit: 2023-05-30 | Discharge: 2023-05-30 | Disposition: A | Discharge status: Home / Self Care | Payer: BC Managed Care – PPO | Attending: Gastroenterology | Admitting: Gastroenterology

## 2023-05-30 ENCOUNTER — Encounter (HOSPITAL_COMMUNITY): Payer: Self-pay | Admitting: Gastroenterology

## 2023-05-30 ENCOUNTER — Encounter (HOSPITAL_COMMUNITY)
Admission: RE | Disposition: A | Discharge status: Home / Self Care | Payer: Self-pay | Source: Home / Self Care | Attending: Gastroenterology

## 2023-05-30 DIAGNOSIS — E6689 Other obesity not elsewhere classified: Secondary | ICD-10-CM | POA: Diagnosis not present

## 2023-05-30 DIAGNOSIS — Z800 Family history of malignant neoplasm of digestive organs: Secondary | ICD-10-CM | POA: Diagnosis not present

## 2023-05-30 DIAGNOSIS — Z1211 Encounter for screening for malignant neoplasm of colon: Secondary | ICD-10-CM | POA: Diagnosis not present

## 2023-05-30 DIAGNOSIS — Z6841 Body Mass Index (BMI) 40.0 and over, adult: Secondary | ICD-10-CM | POA: Insufficient documentation

## 2023-05-30 DIAGNOSIS — Z87891 Personal history of nicotine dependence: Secondary | ICD-10-CM | POA: Insufficient documentation

## 2023-05-30 DIAGNOSIS — I10 Essential (primary) hypertension: Secondary | ICD-10-CM | POA: Diagnosis not present

## 2023-05-30 DIAGNOSIS — K562 Volvulus: Secondary | ICD-10-CM

## 2023-05-30 DIAGNOSIS — K6389 Other specified diseases of intestine: Secondary | ICD-10-CM | POA: Insufficient documentation

## 2023-05-30 DIAGNOSIS — Z79899 Other long term (current) drug therapy: Secondary | ICD-10-CM | POA: Insufficient documentation

## 2023-05-30 DIAGNOSIS — J45909 Unspecified asthma, uncomplicated: Secondary | ICD-10-CM | POA: Insufficient documentation

## 2023-05-30 HISTORY — PX: COLONOSCOPY WITH PROPOFOL: SHX5780

## 2023-05-30 SURGERY — COLONOSCOPY WITH PROPOFOL
Anesthesia: Monitor Anesthesia Care

## 2023-05-30 MED ORDER — PROPOFOL 500 MG/50ML IV EMUL
INTRAVENOUS | Status: DC | PRN
  Administered 2023-05-30: 100 ug/kg/min via INTRAVENOUS
  Administered 2023-05-30: 50 mg via INTRAVENOUS
  Administered 2023-05-30: 40 mg via INTRAVENOUS

## 2023-05-30 MED ORDER — SODIUM CHLORIDE 0.9 % IV SOLN: INTRAVENOUS | Status: DC

## 2023-05-30 MED ORDER — SODIUM CHLORIDE 0.9 % IV SOLN
INTRAVENOUS | Status: AC | PRN
Start: 2023-05-30 — End: 2023-05-30
  Administered 2023-05-30: 500 mL via INTRAMUSCULAR

## 2023-05-30 SURGICAL SUPPLY — 17 items
ELECTRODE REM PT RTRN 9FT ADLT (ELECTROSURGICAL) IMPLANT
FORCEPS BIOP RAD 4 LRG CAP 4 (CUTTING FORCEPS) IMPLANT
FORCEPS BXJMBJMB 240X2.8X (CUTTING FORCEPS) IMPLANT
INJECTOR/SNARE I SNARE (MISCELLANEOUS) IMPLANT
LUBRICANT JELLY 4.5OZ STERILE (MISCELLANEOUS) IMPLANT
MANIFOLD NEPTUNE II (INSTRUMENTS) IMPLANT
NDL SCLEROTHERAPY 25GX240 (NEEDLE) IMPLANT
NEEDLE SCLEROTHERAPY 25GX240 (NEEDLE) IMPLANT
PAD FLOOR 36X40 (MISCELLANEOUS) ×1 IMPLANT
PROBE APC STR FIRE (PROBE) IMPLANT
PROBE INJECTION GOLD 7FR (MISCELLANEOUS) IMPLANT
SNARE ROTATE MED OVAL 20MM (MISCELLANEOUS) IMPLANT
SYR 50ML LL SCALE MARK (SYRINGE) IMPLANT
TRAP SPECIMEN MUCOUS 40CC (MISCELLANEOUS) IMPLANT
TUBING ENDO SMARTCAP PENTAX (MISCELLANEOUS) IMPLANT
TUBING IRRIGATION ENDOGATOR (MISCELLANEOUS) ×1 IMPLANT
WATER STERILE IRR 1000ML POUR (IV SOLUTION) IMPLANT

## 2023-05-30 NOTE — Op Note (Signed)
 [facility] Patient Name: Dillon Hood Procedure Date: 05/30/2023 MRN: [id number] Attending MD: Harry Lindau , MD, [id number] Date of Birth: March 21, 1975 CSN: [id number] Age: 48 Admit Type: Outpatient Procedure:                Colonoscopy Indications:              Colon cancer screening in patient at increased                            risk: Colorectal cancer in father, This is the                            patient's first colonoscopy Providers:                Harry Lindau, MD, Ila Malay, RN, Tyrus Gallus, Technician Referring MD:              Medicines:                Monitored Anesthesia Care Complications:            No immediate complications. Estimated Blood Loss:     Estimated blood loss: none. Procedure:                Pre-Anesthesia Assessment:                           - Prior to the procedure, a History and Physical                            was performed, and patient medications and                            allergies were reviewed. The patient's tolerance of                            previous anesthesia was also reviewed. The risks                            and benefits of the procedure and the sedation                            options and risks were discussed with the patient.                            All questions were answered, and informed consent                            was obtained. Prior Anticoagulants: The patient has                            taken no anticoagulant or antiplatelet agents. ASA                            Grade  Assessment: III - A patient with severe                            systemic disease. After reviewing the risks and                            benefits, the patient was deemed in satisfactory                            condition to undergo the procedure.                           After obtaining informed consent, the colonoscope                            was passed under  direct vision. Throughout the                            procedure, the patient's blood pressure, pulse, and                            oxygen saturations were monitored continuously. The                            CF-HQ190L ([id number]) Olympus colonoscope was                            introduced through the anus and advanced to the the                            cecum, identified by appendiceal orifice and                            ileocecal valve. The colonoscopy was performed                            without difficulty. The patient tolerated the                            procedure well. The quality of the bowel                            preparation was good. The ileocecal valve,                            appendiceal orifice, and rectum were photographed. Scope In: 9:07:33 AM Scope Out: 9:25:04 AM Scope Withdrawal Time: 0 hours 9 minutes 23 seconds  Total Procedure Duration: 0 hours 17 minutes 31 seconds  Findings:      The perianal and digital rectal examinations were normal.      The entire colon appeared normal.      The ascending colon revealed moderately excessive looping. Advancing the       scope required using manual pressure. Advancing the scope required       straightening and shortening the scope  to obtain bowel loop reduction.      The retroflexed view of the distal rectum and anal verge was normal and       showed no anal or rectal abnormalities. Impression:               - The entire examined colon is normal.                           - There was significant looping of the colon.                           - The distal rectum and anal verge are normal on                            retroflexion view.                           - No specimens collected. Moderate Sedation:      Not Applicable - Patient had care per Anesthesia. Recommendation:           - Patient has a contact number available for                            emergencies. The signs and symptoms of  potential                            delayed complications were discussed with the                            patient. Return to normal activities tomorrow.                            Written discharge instructions were provided to the                            patient.                           - Resume previous diet.                           - Continue present medications.                           - Repeat colonoscopy in 5 years for screening                            purposes due to family history of colon cancer.                           - Return to GI office PRN. Procedure Code(s):        --- Professional ---                           W0981, Colorectal cancer screening; colonoscopy on  individual at high risk Diagnosis Code(s):        --- Professional ---                           Z80.0, Family history of malignant neoplasm of                            digestive organs CPT copyright 2022 American Medical Association. All rights reserved. The codes documented in this report are preliminary and upon coder review may  be revised to meet current compliance requirements. Harry Lindau, MD 05/30/2023 9:38:29 AM Number of Addenda: 0

## 2023-05-30 NOTE — Interval H&P Note (Signed)
 History and Physical Interval Note:  05/30/2023 8:19 AM  Dillon Hood  has presented today for surgery, with the diagnosis of screening colon.  The various methods of treatment have been discussed with the patient and family. After consideration of risks, benefits and other options for treatment, the patient has consented to  Procedure(s): COLONOSCOPY WITH PROPOFOL (N/A) as a surgical intervention.  The patient's history has been reviewed, patient examined, no change in status, stable for surgery.  I have reviewed the patient's chart and labs.  Questions were answered to the patient's satisfaction.     Laquetta Plank Persephanie Laatsch

## 2023-05-30 NOTE — Anesthesia Procedure Notes (Signed)
 Procedure Name: MAC Date/Time: 05/30/2023 8:53 AM  Performed by: Darlena Ego, CRNAPre-anesthesia Checklist: Patient identified, Emergency Drugs available, Suction available and Patient being monitored Patient Re-evaluated:Patient Re-evaluated prior to induction Oxygen Delivery Method: Simple face mask Preoxygenation: Pre-oxygenation with 100% oxygen Placement Confirmation: positive ETCO2 and CO2 detector Dental Injury: Teeth and Oropharynx as per pre-operative assessment

## 2023-05-30 NOTE — Anesthesia Preprocedure Evaluation (Signed)
 Anesthesia Evaluation  Patient identified by MRN, date of birth, ID band Patient awake    Reviewed: Allergy & Precautions, NPO status , Patient's Chart, lab work & pertinent test results  Airway Mallampati: III  TM Distance: >3 FB     Dental  (+) Dental Advisory Given   Pulmonary asthma , former smoker   Pulmonary exam normal        Cardiovascular hypertension, Pt. on medications  Rhythm:Regular Rate:Normal     Neuro/Psych negative neurological ROS     GI/Hepatic negative GI ROS, Neg liver ROS,,,  Endo/Other    Class 4 obesity  Renal/GU negative Renal ROS     Musculoskeletal   Abdominal   Peds  Hematology  (+) HIV  Anesthesia Other Findings   Reproductive/Obstetrics                             Anesthesia Physical Anesthesia Plan  ASA: 3  Anesthesia Plan: MAC   Post-op Pain Management:    Induction:   PONV Risk Score and Plan: 1 and Propofol infusion and Treatment may vary due to age or medical condition  Airway Management Planned: Natural Airway and Simple Face Mask  Additional Equipment:   Intra-op Plan:   Post-operative Plan:   Informed Consent: I have reviewed the patients History and Physical, chart, labs and discussed the procedure including the risks, benefits and alternatives for the proposed anesthesia with the patient or authorized representative who has indicated his/her understanding and acceptance.       Plan Discussed with: CRNA  Anesthesia Plan Comments:        Anesthesia Quick Evaluation

## 2023-05-30 NOTE — H&P (Signed)
 GASTROENTEROLOGY PROCEDURE H&P NOTE   Primary Care Physician: [facility]    Reason for Procedure:  Family history of colon cancer, colon cancer screening  Plan:    Colonoscopy   Patient is appropriate for endoscopic procedure(s) at [facility].  The nature of the procedure, as well as the risks, benefits, and alternatives were carefully and thoroughly reviewed with the patient. Ample time for discussion and questions allowed. The patient understood, was satisfied, and agreed to proceed.     HPI: Dillon Hood is a 48 y.o. male who presents for colonoscopy for CRC screening.  Family history notable for father with colon cancer, diagnosed >age 5.  No previous CRC screening.  No recent GI symptoms.  Past Medical History:  Diagnosis Date   Asthma    Bronchitis    HIV positive (HCC)    Hypertension     Past Surgical History:  Procedure Laterality Date   HERNIA REPAIR     umbilical   TONSILLECTOMY     WISDOM TOOTH EXTRACTION      Prior to Admission medications   Medication Sig Start Date End Date Taking? Authorizing Provider  albuterol  (PROVENTIL  HFA) 108 (90 Base) MCG/ACT inhaler Inhale 2 puffs into the lungs every 4 (four) hours as needed for wheezing or shortness of breath. 01/02/18  Yes Jacquie Maudlin, MD  amLODipine (NORVASC) 10 MG tablet Take 10 mg by mouth daily. 08/23/19  Yes [provider]  BIKTARVY  50-200-25 MG TABS tablet Take 1 tablet by mouth daily. 09/23/20  Yes Comer, Judithann Novas, MD  lisinopril (ZESTRIL) 20 MG tablet Take 20 mg by mouth daily. 08/23/19  Yes [provider]  nicotine (NICODERM CQ - DOSED IN MG/24 HOURS) 14 mg/24hr patch Place 14 mg onto the skin daily. 12/13/22  Yes [provider]  rosuvastatin (CRESTOR) 5 MG tablet Take 5 mg by mouth daily. 01/27/23  Yes [provider]  amoxicillin -clavulanate (AUGMENTIN ) 875-125 MG tablet Take 1 tablet by mouth every 12  (twelve) hours. 03/01/23   Levora Reas A, NP  ibuprofen (ADVIL) 600 MG tablet Take 600 mg by mouth every 8 (eight) hours as needed. 08/02/17   [provider]    Current Facility-Administered Medications  Medication Dose Route Frequency Provider Last Rate Last Admin   0.9 %  sodium chloride infusion    Continuous PRN Malakai Schoenherr V, DO 10 mL/hr at 05/30/23 0751 500 mL at 05/30/23 0751   0.9 %  sodium chloride infusion   Intravenous Continuous Nayden Czajka V, DO        Allergies as of 01/31/2023 - Review Complete 01/31/2023  Allergen Reaction Noted   Iodine Anaphylaxis 12/16/2022   Macadamia nut oil Anaphylaxis 01/01/2018   Shellfish allergy Anaphylaxis 01/01/2018   Iodinated contrast media Other (See Comments) 01/01/2018    Family History  Problem Relation Age of Onset   Colon cancer Father    Rectal cancer Neg Hx    Stomach cancer Neg Hx     Social History   Socioeconomic History   Marital status: Single    Spouse name: Not on file   Number of children: Not on file   Years of education: Not on file   Highest education level: Not on file  Occupational History   Not on file  Tobacco Use   Smoking status: Former    Current packs/day: 0.25    Average packs/day: 0.3 packs/day for 25.0 years (6.3 ttl pk-yrs)  Types: Cigarettes   Smokeless tobacco: Never  Vaping Use   Vaping status: Never Used  Substance and Sexual Activity   Alcohol use: Not Currently   Drug use: Not Currently    Types: Cocaine, Marijuana, Methamphetamines    Comment: 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   Sexual activity: Yes    Partners: Male    Comment: condoms given 09/2020  Other Topics Concern   Not on file  Social History Narrative   Not on file   Social Drivers of Health   Financial Resource Strain: Not on file  Food Insecurity: Not on file  Transportation Needs: Not on file  Physical Activity: Not on file  Stress: Not on file  Social  Connections: Not on file  Intimate Partner Violence: Not on file    Physical Exam: Vital signs in last 24 hours: @BP  (!) 154/94   Pulse 75   Temp 98.2 F (36.8 C) (Temporal)   Resp 17   Ht 5\' 10"  (1.778 m)   Wt (!) 158.8 kg   SpO2 97%   BMI 50.22 kg/m  GEN: NAD EYE: Sclerae anicteric ENT: MMM CV: Non-tachycardic Pulm: CTA b/l GI: Soft, NT/ND NEURO:  Alert & Oriented x 3   Harry Lindau, DO [facility]   05/30/2023 8:19 AM

## 2023-05-30 NOTE — Anesthesia Postprocedure Evaluation (Signed)
 Anesthesia Post Note  Patient: Dillon Hood  Procedure(s) Performed: COLONOSCOPY WITH PROPOFOL     Patient location during evaluation: PACU Anesthesia Type: MAC Level of consciousness: awake and alert Pain management: pain level controlled Vital Signs Assessment: post-procedure vital signs reviewed and stable Respiratory status: spontaneous breathing, nonlabored ventilation, respiratory function stable and patient connected to nasal cannula oxygen Cardiovascular status: stable and blood pressure returned to baseline Postop Assessment: no apparent nausea or vomiting Anesthetic complications: no  No notable events documented.  Last Vitals:  Vitals:   05/30/23 0940 05/30/23 0950  BP: (!) 150/89 (!) 138/92  Pulse: 80 72  Resp: 20 18  Temp:    SpO2: 99% 97%    Last Pain:  Vitals:   05/30/23 0939  TempSrc:   PainSc: 0-No pain                 Melvenia Stabs

## 2023-05-30 NOTE — Discharge Instructions (Addendum)
 YOU HAD AN ENDOSCOPIC PROCEDURE TODAY: Refer to the procedure report and other information in the discharge instructions given to you for any specific questions about what was found during the examination. If this information does not answer your questions, please call [place] office at [phone] to clarify.   YOU SHOULD EXPECT: Some feelings of bloating in the abdomen. Passage of more gas than usual. Walking can help get rid of the air that was put into your GI tract during the procedure and reduce the bloating. If you had a lower endoscopy (such as a colonoscopy or flexible sigmoidoscopy) you may notice spotting of blood in your stool or on the toilet paper. Some abdominal soreness may be present for a day or two, also.  DIET: Your first meal following the procedure should be a light meal and then it is ok to progress to your normal diet. A half-sandwich or bowl of soup is an example of a good first meal. Heavy or fried foods are harder to digest and may make you feel nauseous or bloated. Drink plenty of fluids but you should avoid alcoholic beverages for 24 hours. If you had a esophageal dilation, please see attached instructions for diet.    ACTIVITY: Your care partner should take you home directly after the procedure. You should plan to take it easy, moving slowly for the rest of the day. You can resume normal activity the day after the procedure however YOU SHOULD NOT DRIVE, use power tools, machinery or perform tasks that involve climbing or major physical exertion for 24 hours (because of the sedation medicines used during the test).   SYMPTOMS TO REPORT IMMEDIATELY: A gastroenterologist can be reached at any hour. Please call [phone]  for any of the following symptoms:  Following lower endoscopy (colonoscopy, flexible sigmoidoscopy) Excessive amounts of blood in the stool  Significant tenderness, worsening of abdominal pains  Swelling of the abdomen that is new, acute  Fever of 100 or  higher   FOLLOW UP:  If any biopsies were taken you will be contacted by phone or by letter within the next 1-3 weeks. Call [phone]  if you have not heard about the biopsies in 3 weeks.  Please also call with any specific questions about appointments or follow up tests.YOU HAD AN ENDOSCOPIC PROCEDURE TODAY: Refer to the procedure report and other information in the discharge instructions given to you for any specific questions about what was found during the examination. If this information does not answer your questions, please call [place] office at [phone] to clarify.   YOU SHOULD EXPECT: Some feelings of bloating in the abdomen. Passage of more gas than usual. Walking can help get rid of the air that was put into your GI tract during the procedure and reduce the bloating. If you had a lower endoscopy (such as a colonoscopy or flexible sigmoidoscopy) you may notice spotting of blood in your stool or on the toilet paper. Some abdominal soreness may be present for a day or two, also.  DIET: Your first meal following the procedure should be a light meal and then it is ok to progress to your normal diet. A half-sandwich or bowl of soup is an example of a good first meal. Heavy or fried foods are harder to digest and may make you feel nauseous or bloated. Drink plenty of fluids but you should avoid alcoholic beverages for 24 hours. If you had a esophageal dilation, please see attached instructions for diet.    ACTIVITY: Your  care partner should take you home directly after the procedure. You should plan to take it easy, moving slowly for the rest of the day. You can resume normal activity the day after the procedure however YOU SHOULD NOT DRIVE, use power tools, machinery or perform tasks that involve climbing or major physical exertion for 24 hours (because of the sedation medicines used during the test).   SYMPTOMS TO REPORT IMMEDIATELY: A gastroenterologist can be reached at any hour. Please  call [phone]  for any of the following symptoms:  Following lower endoscopy (colonoscopy, flexible sigmoidoscopy) Excessive amounts of blood in the stool  Significant tenderness, worsening of abdominal pains  Swelling of the abdomen that is new, acute  Fever of 100 or higher  Following upper endoscopy (EGD, EUS, ERCP, esophageal dilation) Vomiting of blood or coffee ground material  New, significant abdominal pain  New, significant chest pain or pain under the shoulder blades  Painful or persistently difficult swallowing  New shortness of breath  Black, tarry-looking or red, bloody stools  FOLLOW UP:  If any biopsies were taken you will be contacted by phone or by letter within the next 1-3 weeks. Call [phone]  if you have not heard about the biopsies in 3 weeks.  Please also call with any specific questions about appointments or follow up tests.

## 2023-05-30 NOTE — Transfer of Care (Signed)
 Immediate Anesthesia Transfer of Care Note  Patient: Dillon Hood  Procedure(s) Performed: COLONOSCOPY WITH PROPOFOL  Patient Location: PACU and Endoscopy Unit  Anesthesia Type:MAC  Level of Consciousness: awake  Airway & Oxygen Therapy: Patient Spontanous Breathing and Patient connected to nasal cannula oxygen  Post-op Assessment: Report given to RN and Post -op Vital signs reviewed and stable  Post vital signs: Reviewed and stable  Last Vitals:  Vitals Value Taken Time  BP 135/84 05/30/23 0937  Temp    Pulse 79 05/30/23 0938  Resp 18 05/30/23 0938  SpO2 98 % 05/30/23 0938  Vitals shown include unfiled device data.  Last Pain:  Vitals:   05/30/23 0746  TempSrc: Temporal  PainSc: 0-No pain         Complications: No notable events documented.

## 2023-05-31 ENCOUNTER — Telehealth: Payer: Self-pay | Admitting: Gastroenterology

## 2023-05-31 NOTE — Telephone Encounter (Signed)
 Returned called to patient. Patient states that he just "feels sleepy and sluggish today". Patient states that he ate turnip greens yesterday and washed clothes. He states that he may have over done it instead of resting. Patient did go to work today, he states that he has already had a solid bowel movement. Pt will see if he can work half a day tomorrow to rest. I advised patient that he should increase his electrolytes as well. Denies any abdominal pain, fever, or vomiting. Patient reports "slight nausea". I have advised patient of ER precautions. Patient will follow up with us  tomorrow by phone if he is not improving. Pt verbalized understanding and had no concerns at the end of the call.

## 2023-05-31 NOTE — Telephone Encounter (Signed)
 Patient is calling because he had a procedure done at the hospital yesterday. Patient stated that he is not feeling well like he has been feeling sluggish and tired for a couple of hours now. Patient stated that he has not be himself. Patient is requesting a call back. Please advise.

## 2023-06-01 ENCOUNTER — Encounter (HOSPITAL_COMMUNITY): Payer: Self-pay | Admitting: Gastroenterology
